# Patient Record
Sex: Female | Born: 1975 | Race: White | Hispanic: No | Marital: Single | Smoking: Current every day smoker
Health system: Southern US, Community
[De-identification: ages and names within clinical notes are randomized; demographics above are authoritative.]

## PROBLEM LIST (undated history)

## (undated) DIAGNOSIS — IMO0002 Reserved for concepts with insufficient information to code with codable children: Secondary | ICD-10-CM

## (undated) HISTORY — PX: TUBAL LIGATION: SHX77

---

## 1998-06-01 ENCOUNTER — Emergency Department (HOSPITAL_COMMUNITY): Admission: EM | Admit: 1998-06-01 | Discharge: 1998-06-01 | Payer: Self-pay | Admitting: Emergency Medicine

## 1998-07-05 ENCOUNTER — Inpatient Hospital Stay (HOSPITAL_COMMUNITY): Admission: AD | Admit: 1998-07-05 | Discharge: 1998-07-05 | Payer: Self-pay | Admitting: Family Medicine

## 1999-01-08 ENCOUNTER — Inpatient Hospital Stay (HOSPITAL_COMMUNITY): Admission: AD | Admit: 1999-01-08 | Discharge: 1999-01-13 | Payer: Self-pay | Admitting: *Deleted

## 1999-01-18 ENCOUNTER — Emergency Department (HOSPITAL_COMMUNITY): Admission: EM | Admit: 1999-01-18 | Discharge: 1999-01-18 | Payer: Self-pay | Admitting: Emergency Medicine

## 1999-01-18 ENCOUNTER — Encounter: Payer: Self-pay | Admitting: Emergency Medicine

## 1999-05-10 ENCOUNTER — Other Ambulatory Visit: Admission: RE | Admit: 1999-05-10 | Discharge: 1999-05-10 | Payer: Self-pay | Admitting: Obstetrics and Gynecology

## 1999-05-20 ENCOUNTER — Inpatient Hospital Stay (HOSPITAL_COMMUNITY): Admission: AD | Admit: 1999-05-20 | Discharge: 1999-05-20 | Payer: Self-pay | Admitting: Obstetrics & Gynecology

## 1999-05-26 ENCOUNTER — Inpatient Hospital Stay (HOSPITAL_COMMUNITY): Admission: AD | Admit: 1999-05-26 | Discharge: 1999-05-26 | Payer: Self-pay | Admitting: Obstetrics and Gynecology

## 1999-06-01 ENCOUNTER — Encounter: Payer: Self-pay | Admitting: Obstetrics and Gynecology

## 1999-06-01 ENCOUNTER — Inpatient Hospital Stay (HOSPITAL_COMMUNITY): Admission: AD | Admit: 1999-06-01 | Discharge: 1999-06-04 | Payer: Self-pay | Admitting: Obstetrics and Gynecology

## 1999-06-01 ENCOUNTER — Ambulatory Visit (HOSPITAL_COMMUNITY): Admission: RE | Admit: 1999-06-01 | Discharge: 1999-06-01 | Payer: Self-pay | Admitting: Obstetrics and Gynecology

## 1999-06-02 ENCOUNTER — Encounter (INDEPENDENT_AMBULATORY_CARE_PROVIDER_SITE_OTHER): Payer: Self-pay | Admitting: Specialist

## 1999-08-14 ENCOUNTER — Inpatient Hospital Stay (HOSPITAL_COMMUNITY): Admission: AD | Admit: 1999-08-14 | Discharge: 1999-08-14 | Payer: Self-pay | Admitting: *Deleted

## 1999-08-14 ENCOUNTER — Encounter (INDEPENDENT_AMBULATORY_CARE_PROVIDER_SITE_OTHER): Payer: Self-pay

## 1999-11-02 ENCOUNTER — Encounter: Payer: Self-pay | Admitting: Emergency Medicine

## 1999-11-02 ENCOUNTER — Emergency Department (HOSPITAL_COMMUNITY): Admission: EM | Admit: 1999-11-02 | Discharge: 1999-11-02 | Payer: Self-pay | Admitting: Emergency Medicine

## 1999-11-03 ENCOUNTER — Encounter: Payer: Self-pay | Admitting: Emergency Medicine

## 1999-12-27 ENCOUNTER — Inpatient Hospital Stay (HOSPITAL_COMMUNITY): Admission: AD | Admit: 1999-12-27 | Discharge: 1999-12-27 | Payer: Self-pay | Admitting: Obstetrics & Gynecology

## 2000-11-27 ENCOUNTER — Other Ambulatory Visit: Admission: RE | Admit: 2000-11-27 | Discharge: 2000-11-27 | Payer: Self-pay | Admitting: Obstetrics and Gynecology

## 2000-12-18 ENCOUNTER — Encounter: Payer: Self-pay | Admitting: Obstetrics and Gynecology

## 2000-12-18 ENCOUNTER — Ambulatory Visit (HOSPITAL_COMMUNITY): Admission: RE | Admit: 2000-12-18 | Discharge: 2000-12-18 | Payer: Self-pay | Admitting: Obstetrics and Gynecology

## 2000-12-30 ENCOUNTER — Inpatient Hospital Stay (HOSPITAL_COMMUNITY): Admission: RE | Admit: 2000-12-30 | Discharge: 2001-01-02 | Payer: Self-pay | Admitting: *Deleted

## 2000-12-30 ENCOUNTER — Encounter: Payer: Self-pay | Admitting: *Deleted

## 2000-12-30 ENCOUNTER — Encounter (HOSPITAL_COMMUNITY): Admission: RE | Admit: 2000-12-30 | Discharge: 2001-03-25 | Payer: Self-pay | Admitting: *Deleted

## 2001-01-15 ENCOUNTER — Encounter: Admission: RE | Admit: 2001-01-15 | Discharge: 2001-01-15 | Payer: Self-pay | Admitting: Obstetrics

## 2001-01-29 ENCOUNTER — Encounter: Admission: RE | Admit: 2001-01-29 | Discharge: 2001-01-29 | Payer: Self-pay | Admitting: Obstetrics

## 2001-02-04 ENCOUNTER — Inpatient Hospital Stay (HOSPITAL_COMMUNITY): Admission: AD | Admit: 2001-02-04 | Discharge: 2001-02-10 | Payer: Self-pay | Admitting: Obstetrics

## 2001-02-05 ENCOUNTER — Encounter: Payer: Self-pay | Admitting: Obstetrics

## 2001-02-18 ENCOUNTER — Encounter: Admission: RE | Admit: 2001-02-18 | Discharge: 2001-02-18 | Payer: Self-pay | Admitting: Obstetrics & Gynecology

## 2001-02-26 ENCOUNTER — Inpatient Hospital Stay (HOSPITAL_COMMUNITY): Admission: AD | Admit: 2001-02-26 | Discharge: 2001-03-04 | Payer: Self-pay | Admitting: Obstetrics & Gynecology

## 2001-02-28 ENCOUNTER — Encounter: Payer: Self-pay | Admitting: *Deleted

## 2001-03-14 ENCOUNTER — Inpatient Hospital Stay (HOSPITAL_COMMUNITY): Admission: AD | Admit: 2001-03-14 | Discharge: 2001-03-14 | Payer: Self-pay | Admitting: *Deleted

## 2001-03-15 ENCOUNTER — Encounter: Payer: Self-pay | Admitting: *Deleted

## 2001-03-15 ENCOUNTER — Inpatient Hospital Stay (HOSPITAL_COMMUNITY): Admission: AD | Admit: 2001-03-15 | Discharge: 2001-03-15 | Payer: Self-pay | Admitting: *Deleted

## 2001-03-18 ENCOUNTER — Encounter: Admission: RE | Admit: 2001-03-18 | Discharge: 2001-03-18 | Payer: Self-pay | Admitting: Obstetrics & Gynecology

## 2001-03-20 ENCOUNTER — Inpatient Hospital Stay (HOSPITAL_COMMUNITY): Admission: AD | Admit: 2001-03-20 | Discharge: 2001-03-27 | Payer: Self-pay | Admitting: *Deleted

## 2001-03-20 ENCOUNTER — Encounter: Payer: Self-pay | Admitting: *Deleted

## 2001-03-20 ENCOUNTER — Encounter (INDEPENDENT_AMBULATORY_CARE_PROVIDER_SITE_OTHER): Payer: Self-pay

## 2001-03-22 ENCOUNTER — Encounter: Payer: Self-pay | Admitting: Obstetrics & Gynecology

## 2001-03-29 ENCOUNTER — Inpatient Hospital Stay (HOSPITAL_COMMUNITY): Admission: AD | Admit: 2001-03-29 | Discharge: 2001-03-29 | Payer: Self-pay | Admitting: Obstetrics

## 2001-04-04 ENCOUNTER — Emergency Department (HOSPITAL_COMMUNITY): Admission: EM | Admit: 2001-04-04 | Discharge: 2001-04-04 | Payer: Self-pay | Admitting: Emergency Medicine

## 2001-04-04 ENCOUNTER — Encounter: Payer: Self-pay | Admitting: Emergency Medicine

## 2002-06-10 ENCOUNTER — Encounter: Payer: Self-pay | Admitting: Emergency Medicine

## 2002-06-10 ENCOUNTER — Emergency Department (HOSPITAL_COMMUNITY): Admission: EM | Admit: 2002-06-10 | Discharge: 2002-06-10 | Payer: Self-pay | Admitting: Emergency Medicine

## 2002-06-12 ENCOUNTER — Inpatient Hospital Stay (HOSPITAL_COMMUNITY): Admission: AD | Admit: 2002-06-12 | Discharge: 2002-06-20 | Payer: Self-pay | Admitting: Psychiatry

## 2002-06-21 ENCOUNTER — Other Ambulatory Visit (HOSPITAL_COMMUNITY): Admission: RE | Admit: 2002-06-21 | Discharge: 2002-06-30 | Payer: Self-pay | Admitting: Psychiatry

## 2002-08-02 ENCOUNTER — Inpatient Hospital Stay (HOSPITAL_COMMUNITY): Admission: EM | Admit: 2002-08-02 | Discharge: 2002-08-11 | Payer: Self-pay | Admitting: Psychiatry

## 2002-08-11 ENCOUNTER — Encounter: Payer: Self-pay | Admitting: Emergency Medicine

## 2002-08-11 ENCOUNTER — Emergency Department (HOSPITAL_COMMUNITY): Admission: EM | Admit: 2002-08-11 | Discharge: 2002-08-11 | Payer: Self-pay | Admitting: Emergency Medicine

## 2002-09-05 ENCOUNTER — Encounter: Admission: RE | Admit: 2002-09-05 | Discharge: 2002-09-05 | Payer: Self-pay | Admitting: Orthopedic Surgery

## 2002-09-05 ENCOUNTER — Encounter: Payer: Self-pay | Admitting: Orthopedic Surgery

## 2003-01-20 ENCOUNTER — Encounter: Admission: RE | Admit: 2003-01-20 | Discharge: 2003-02-15 | Payer: Self-pay | Admitting: Anesthesiology

## 2003-04-12 ENCOUNTER — Inpatient Hospital Stay (HOSPITAL_COMMUNITY): Admission: EM | Admit: 2003-04-12 | Discharge: 2003-04-18 | Payer: Self-pay | Admitting: Psychiatry

## 2003-06-04 ENCOUNTER — Inpatient Hospital Stay (HOSPITAL_COMMUNITY): Admission: EM | Admit: 2003-06-04 | Discharge: 2003-06-09 | Payer: Self-pay | Admitting: Psychiatry

## 2003-11-02 ENCOUNTER — Inpatient Hospital Stay (HOSPITAL_COMMUNITY): Admission: EM | Admit: 2003-11-02 | Discharge: 2003-11-07 | Payer: Self-pay | Admitting: Psychiatry

## 2004-03-10 ENCOUNTER — Inpatient Hospital Stay (HOSPITAL_COMMUNITY): Admission: EM | Admit: 2004-03-10 | Discharge: 2004-03-17 | Payer: Self-pay | Admitting: *Deleted

## 2004-06-15 ENCOUNTER — Inpatient Hospital Stay (HOSPITAL_COMMUNITY): Admission: EM | Admit: 2004-06-15 | Discharge: 2004-06-20 | Payer: Self-pay | Admitting: Psychiatry

## 2004-07-02 ENCOUNTER — Other Ambulatory Visit: Admission: RE | Admit: 2004-07-02 | Discharge: 2004-07-02 | Payer: Self-pay | Admitting: Family Medicine

## 2005-03-31 ENCOUNTER — Inpatient Hospital Stay (HOSPITAL_COMMUNITY): Admission: EM | Admit: 2005-03-31 | Discharge: 2005-04-05 | Payer: Self-pay | Admitting: Psychiatry

## 2005-03-31 ENCOUNTER — Ambulatory Visit: Payer: Self-pay | Admitting: Psychiatry

## 2005-04-20 ENCOUNTER — Inpatient Hospital Stay (HOSPITAL_COMMUNITY): Admission: RE | Admit: 2005-04-20 | Discharge: 2005-04-23 | Payer: Self-pay | Admitting: Psychiatry

## 2005-05-15 ENCOUNTER — Emergency Department (HOSPITAL_COMMUNITY): Admission: EM | Admit: 2005-05-15 | Discharge: 2005-05-15 | Payer: Self-pay | Admitting: Emergency Medicine

## 2005-06-06 ENCOUNTER — Other Ambulatory Visit: Admission: RE | Admit: 2005-06-06 | Discharge: 2005-06-06 | Payer: Self-pay | Admitting: Family Medicine

## 2005-07-04 ENCOUNTER — Emergency Department (HOSPITAL_COMMUNITY): Admission: EM | Admit: 2005-07-04 | Discharge: 2005-07-05 | Payer: Self-pay | Admitting: Emergency Medicine

## 2005-08-18 ENCOUNTER — Emergency Department (HOSPITAL_COMMUNITY): Admission: EM | Admit: 2005-08-18 | Discharge: 2005-08-18 | Payer: Self-pay

## 2005-08-19 ENCOUNTER — Emergency Department (HOSPITAL_COMMUNITY): Admission: EM | Admit: 2005-08-19 | Discharge: 2005-08-19 | Payer: Self-pay | Admitting: Family Medicine

## 2005-11-25 ENCOUNTER — Inpatient Hospital Stay (HOSPITAL_COMMUNITY): Admission: RE | Admit: 2005-11-25 | Discharge: 2005-11-28 | Payer: Self-pay | Admitting: Psychiatry

## 2005-11-26 ENCOUNTER — Ambulatory Visit: Payer: Self-pay | Admitting: Psychiatry

## 2006-04-21 ENCOUNTER — Emergency Department (HOSPITAL_COMMUNITY): Admission: EM | Admit: 2006-04-21 | Discharge: 2006-04-22 | Payer: Self-pay | Admitting: Emergency Medicine

## 2008-10-31 ENCOUNTER — Emergency Department (HOSPITAL_COMMUNITY): Admission: EM | Admit: 2008-10-31 | Discharge: 2008-10-31 | Payer: Self-pay | Admitting: Emergency Medicine

## 2009-05-25 ENCOUNTER — Inpatient Hospital Stay (HOSPITAL_COMMUNITY): Admission: AD | Admit: 2009-05-25 | Discharge: 2009-05-25 | Payer: Self-pay | Admitting: Obstetrics & Gynecology

## 2009-10-16 ENCOUNTER — Emergency Department (HOSPITAL_COMMUNITY): Admission: EM | Admit: 2009-10-16 | Discharge: 2009-10-16 | Payer: Self-pay | Admitting: Emergency Medicine

## 2010-02-26 ENCOUNTER — Emergency Department (HOSPITAL_COMMUNITY): Admission: EM | Admit: 2010-02-26 | Discharge: 2010-02-26 | Payer: Self-pay | Admitting: Emergency Medicine

## 2010-10-09 ENCOUNTER — Emergency Department (HOSPITAL_COMMUNITY)
Admission: EM | Admit: 2010-10-09 | Discharge: 2010-10-09 | Payer: Self-pay | Source: Home / Self Care | Admitting: Emergency Medicine

## 2011-01-13 ENCOUNTER — Emergency Department (HOSPITAL_COMMUNITY)
Admission: EM | Admit: 2011-01-13 | Discharge: 2011-01-13 | Discharge status: Against Medical Advice | Payer: Medicare Other | Attending: Emergency Medicine | Admitting: Emergency Medicine

## 2011-01-13 DIAGNOSIS — R51 Headache: Secondary | ICD-10-CM | POA: Insufficient documentation

## 2011-01-13 DIAGNOSIS — R05 Cough: Secondary | ICD-10-CM | POA: Insufficient documentation

## 2011-01-13 DIAGNOSIS — R059 Cough, unspecified: Secondary | ICD-10-CM | POA: Insufficient documentation

## 2011-01-13 DIAGNOSIS — R197 Diarrhea, unspecified: Secondary | ICD-10-CM | POA: Insufficient documentation

## 2011-01-13 DIAGNOSIS — R112 Nausea with vomiting, unspecified: Secondary | ICD-10-CM | POA: Insufficient documentation

## 2011-03-08 ENCOUNTER — Emergency Department (HOSPITAL_COMMUNITY)
Admission: EM | Admit: 2011-03-08 | Discharge: 2011-03-08 | Disposition: A | Discharge status: Home / Self Care | Payer: Medicare Other | Attending: Emergency Medicine | Admitting: Emergency Medicine

## 2011-03-08 ENCOUNTER — Emergency Department (HOSPITAL_COMMUNITY): Payer: Medicare Other

## 2011-03-08 DIAGNOSIS — S99919A Unspecified injury of unspecified ankle, initial encounter: Secondary | ICD-10-CM | POA: Insufficient documentation

## 2011-03-08 DIAGNOSIS — Z79899 Other long term (current) drug therapy: Secondary | ICD-10-CM | POA: Insufficient documentation

## 2011-03-08 DIAGNOSIS — M25476 Effusion, unspecified foot: Secondary | ICD-10-CM | POA: Insufficient documentation

## 2011-03-08 DIAGNOSIS — F411 Generalized anxiety disorder: Secondary | ICD-10-CM | POA: Insufficient documentation

## 2011-03-08 DIAGNOSIS — M25519 Pain in unspecified shoulder: Secondary | ICD-10-CM | POA: Insufficient documentation

## 2011-03-08 DIAGNOSIS — S8990XA Unspecified injury of unspecified lower leg, initial encounter: Secondary | ICD-10-CM | POA: Insufficient documentation

## 2011-03-08 DIAGNOSIS — M79609 Pain in unspecified limb: Secondary | ICD-10-CM | POA: Insufficient documentation

## 2011-03-08 DIAGNOSIS — F319 Bipolar disorder, unspecified: Secondary | ICD-10-CM | POA: Insufficient documentation

## 2011-03-08 DIAGNOSIS — M25579 Pain in unspecified ankle and joints of unspecified foot: Secondary | ICD-10-CM | POA: Insufficient documentation

## 2011-03-08 DIAGNOSIS — M546 Pain in thoracic spine: Secondary | ICD-10-CM | POA: Insufficient documentation

## 2011-03-08 DIAGNOSIS — IMO0002 Reserved for concepts with insufficient information to code with codable children: Secondary | ICD-10-CM | POA: Insufficient documentation

## 2011-03-08 DIAGNOSIS — M25473 Effusion, unspecified ankle: Secondary | ICD-10-CM | POA: Insufficient documentation

## 2011-03-08 DIAGNOSIS — R209 Unspecified disturbances of skin sensation: Secondary | ICD-10-CM | POA: Insufficient documentation

## 2011-03-08 DIAGNOSIS — S93409A Sprain of unspecified ligament of unspecified ankle, initial encounter: Secondary | ICD-10-CM | POA: Insufficient documentation

## 2011-03-09 ENCOUNTER — Emergency Department (HOSPITAL_COMMUNITY)
Admission: EM | Admit: 2011-03-09 | Discharge: 2011-03-09 | Disposition: A | Discharge status: Home / Self Care | Payer: No Typology Code available for payment source | Attending: Emergency Medicine | Admitting: Emergency Medicine

## 2011-03-09 ENCOUNTER — Emergency Department (HOSPITAL_COMMUNITY): Payer: No Typology Code available for payment source

## 2011-03-09 DIAGNOSIS — M542 Cervicalgia: Secondary | ICD-10-CM | POA: Insufficient documentation

## 2011-03-09 DIAGNOSIS — Y92410 Unspecified street and highway as the place of occurrence of the external cause: Secondary | ICD-10-CM | POA: Insufficient documentation

## 2011-03-09 DIAGNOSIS — T1490XA Injury, unspecified, initial encounter: Secondary | ICD-10-CM | POA: Insufficient documentation

## 2011-03-18 LAB — GC/CHLAMYDIA PROBE AMP, GENITAL
Chlamydia, DNA Probe: NEGATIVE
GC Probe Amp, Genital: NEGATIVE

## 2011-03-18 LAB — URINALYSIS, ROUTINE W REFLEX MICROSCOPIC
Bilirubin Urine: NEGATIVE
Leukocytes, UA: NEGATIVE
Urobilinogen, UA: 0.2 mg/dL (ref 0.0–1.0)
pH: 6 (ref 5.0–8.0)

## 2011-03-18 LAB — POCT PREGNANCY, URINE: Preg Test, Ur: NEGATIVE

## 2011-03-18 LAB — WET PREP, GENITAL: Trich, Wet Prep: NONE SEEN

## 2011-03-18 LAB — URINE MICROSCOPIC-ADD ON
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
## 2011-04-26 NOTE — Discharge Summary (Signed)
[name], [name] NO.:  [id number]   MEDICAL RECORD NO.:  [id number]                    PATIENT TYPE:  IPS   LOCATION:                                       FACILITY:  [facility]   PHYSICIAN:  Jeanice Lim, MD                DATE OF BIRTH:  12/23/1975   DATE OF ADMISSION:  06/12/2002  DATE OF DISCHARGE:  06/20/2002                                 DISCHARGE SUMMARY   IDENTIFYING DATA:  This is a 35 year old single Caucasian female with  history of bipolar disorder, with a two-week history of increased  irritability, poor sleeping, ignoring household chores and having difficulty  attending her child.   PAST PSYCHIATRIC HISTORY:  The patient is followed by Dr. Madilyn Fireman at [facility].   MEDICATIONS:  Adderall for ADHD symptoms.   ALLERGIES:  None.   PHYSICAL EXAMINATION:  Essentially within normal limits.  Neurologically  nonfocal.   LABORATORY DATA:  Urine drug screen positive for cocaine.  Otherwise  essentially within normal limits.   MENTAL STATUS EXAM:  Overweight, tall, healthy-appearing female in no acute  distress.  Blunted and irritable affect.  Cooperative.  Speech normal.  Mood  somewhat irritable, mildly depressed.  Thought process goal directed.  Thought content negative for psychotic symptoms or dangerous ideation.  Cognitively intact.  Judgment and insight fair.   ADMISSION DIAGNOSES:   AXIS I:  1. Mood disorder not otherwise specified.  2. Marijuana abuse.  3. Cocaine abuse.  4. Attention-deficit disorder by history.   AXIS II:  Rule out borderline personality disorder.   AXIS III:  Chronic lower back pain.   AXIS IV:  Moderate (primary support system limitations).   AXIS V:  35/65.   HOSPITAL COURSE:  The patient was admitted and ordered routine p.r.n.  medications and underwent further monitoring.  Trileptal was started and  optimized for mood stabilization.  The patient reported a positive response  and mother  was contact.  Felt patient had improved and was safe for  outpatient treatment.   CONDITION ON DISCHARGE:  Markedly improved.  Mood was more stable and  euthymic.  Affect brighter.  Thought processes goal directed.  Thought  content negative for dangerous ideation or psychotic symptoms.  The patient  reported motivation to be compliant with follow-up plan and remain sober.   DISCHARGE MEDICATIONS:  1. Celexa 20 mg q.h.s.  2. Abilify 10 mg q.h.s.  3. Trileptal 150 mg 3 p.m. and 3 q.h.s.  4. Vistaril 50 mg q.8h. p.r.n.   FOLLOW UP:  The patient was to follow up with NA and AA and followup with  [facility] on Monday, June 21, 2002.   DISCHARGE DIAGNOSES:   AXIS I:  1. Mood disorder not otherwise specified.  2. Marijuana abuse.  3. Cocaine abuse.  4. Attention-deficit disorder by history.  AXIS II:  Rule out borderline personality disorder.   AXIS III:  Chronic lower back pain.   AXIS IV:  Moderate (primary support system limitations).   AXIS V:  Global Assessment of Functioning on discharge 55.                                                 Jeanice Lim, MD    JEM/MEDQ  D:  08/19/2002  T:  08/20/2002  Job:  [id number]

## 2011-04-26 NOTE — Discharge Summary (Signed)
NAME:  [name], [name]                        ACCOUNT NO.:  [id number]   MEDICAL RECORD NO.:  [id number]                   PATIENT TYPE:  IPS   LOCATION:  0503                                 FACILITY:  [facility]   PHYSICIAN:  Jeanice Lim, M.D.              DATE OF BIRTH:  06/01/1976   DATE OF ADMISSION:  06/04/2003  DATE OF DISCHARGE:  06/09/2003                                 DISCHARGE SUMMARY   IDENTIFYING DATA:  This is a 35 year old Caucasian, single female.  She  presented to [facility] requesting long-term placement for  substance abuse.  She reported suicidal and homicidal ideation.  She  reported depressive symptoms and wanted to stop abusing drugs.   MEDICATIONS:  The patient had been noncompliant with Wellbutrin and  Risperdal.   DRUG ALLERGIES:  No known drug allergies.   PHYSICAL EXAMINATION:  GENERAL:  Essentially within normal limits.  NEUROLOGIC:  Nonfocal.   LABORATORY DATA:  Routine admission labs: Within normal limits.   MENTAL STATUS EXAM:  Alert and oriented, fair hygiene, cooperative.  Speech:  Not pressured.  Mood: Mildly depressed.  Affect: Restricted.  Thought  process: Goal directed.  Thought content: Negative for dangerous ideation or  psychotic symptoms.  At the time of evaluation, cognition was intact.  Judgment and insight: Fair.   ADMISSION DIAGNOSES:   AXIS I:  1. Substance-induced mood disorder.  2. Cocaine dependence.  3. Marijuana abuse.  4. Possible depression, not otherwise specified.   AXIS II:  Deferred.   AXIS III:  1. Degenerative joint disease.  2. Chronic pain.  3. Obesity.  4. Status post Cesarean section.   AXIS IV:  Moderate; limited support system, economic problems, problems  related to legal system, and medical problems.   AXIS V:  [id number]   HOSPITAL COURSE:  The patient was admitted, ordered routine p.r.n.  medications, underwent further monitoring, and was encouraged to participate  in individual,  group, and milieu therapy.  The patient was treated for  withdrawal symptoms, continued on detoxification monitoring for safety and  stabilized on Trileptal and Risperdal.  The patient complained of upper  respiratory infection symptoms and these were evaluated and treated and she  was stabilized on medications, reporting motivation to follow up with [facility].  The patient's mother was concerned that the patient was not ready to  be discharged, wanting her to go directly to residential treatment.  However, the patient was comfortable with this plan, which had been agreed  upon with aftercare planning, the case manger, and the patient.   CONDITION ON DISCHARGE:  The patient's condition on discharge was improved.  Mood was more euthymic.  Affect: Brighter.  Thought processes: Goal  directed.  Thought content: Negative for dangerous ideation or psychotic  symptoms.   DISCHARGE MEDICATIONS:  1. Risperdal 0.25 mg q.h.s.  2. Wellbutrin XL 300 mg q.a.m.  3. Trileptal 300 mg one  half q.a.m., one half at 3 p.m., and one q.h.s.  4. Ventolin 90 mcg inhaler two puffs q.6.h. p.r.n.   FOLLOW UP:  The patient was discharged to follow up at [facility] on Wednesday, July 7 at 9 a.m.   DISCHARGE DIAGNOSES:   AXIS I:  1. Substance-induced mood disorder.  2. Cocaine dependence.  3. Marijuana abuse.  4. Possible depression, not otherwise specified.   AXIS II:  Deferred.   AXIS III:  1. Degenerative joint disease.  2. Chronic pain.  3. Obesity.  4. Status post Cesarean section.   AXIS IV:  Moderate; limited support system, economic problems, problems  related to legal system, and medical problems.   AXIS V:  Global assessment of functioning on discharge was 50-55.                                               Jeanice Lim, M.D.    JEM/MEDQ  D:  06/30/2003  T:  07/01/2003  Job:  [id number]
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
## 2011-04-26 NOTE — H&P (Signed)
NAME:  [name], [name]                        ACCOUNT NO.:  [id number]   MEDICAL RECORD NO.:  [id number]                   PATIENT TYPE:  IPS   LOCATION:  0503                                 FACILITY:  [facility]   PHYSICIAN:  Geoffery Lyons, M.D.                   DATE OF BIRTH:  01-Oct-1976   DATE OF ADMISSION:  06/04/2003  DATE OF DISCHARGE:                         PSYCHIATRIC ADMISSION ASSESSMENT   IDENTIFYING INFORMATION:  This is a voluntary admission.  This 35 year old  white single female presented to our intake office here at [facility] last evening requesting long-term placement for her substance abuse.  She stated that she had presented earlier in the day over at [facility] but  was kept waiting in the emergency room for 4 hours and left AMA.  She  presented here endorsing suicidal and homicidal ideation.  She states that  she knows she needs to beat her depression and stop abusing drugs.  She  states that she is crying for no reason, sleeping all the time, and not  wanting to be alive.  She uses powder and rock cocaine, marijuana.  She has  used methamphetamines, ketamine, lots of ecstasy, acid, etc. in the past.  She says about the only substance she does not use if heroin.  She said it  worries her because she does not even enjoy using anymore.  It is like  having a job.   PAST PSYCHIATRIC HISTORY:  The patient acknowledges 8 prior admissions over  the past 5 years.  She is supposed to be followed down at [facility] by Dr. Hortencia Pilar, however she is noncompliant with her  appointments.   SOCIAL HISTORY:  She states that she takes college courses.  She lives in a  house with her 3 children, her son age 53, her boy-girl twins who are 2 years  old.  She gets SSI.  She has Section 8 housing.  She has rendered disabled  due to her degenerative disk disease and her mental illness.  Her children  have different fathers.  The 50 year old  unfortunately is not getting  financial support from his father.  The twins are getting WIC and food  stamps.   FAMILY HISTORY:  The patient says she has an aunt who is well known to this  facility who also has bipolar disorder.   ALCOHOL AND DRUG ABUSE:  The patient acknowledges smoking 1-1/2 packs of  cigarettes per day for the past 12 years, extensive substance abuse as  already indicated, and does not usually abuse alcohol.   PAST MEDICAL HISTORY:  Primary care Arnetra Terris is Dr. Adonis Housekeeper at [facility].  Apparently he prescribes Adderall for her when she needs  to focus and concentrate and go to class.  She is followed by Dr. Charlett Blake for  degenerative disk disease and the pain clinic prescribes Percocet when  needed for her.  She claims that she is currently prescribed Wellbutrin XL  150 mg a day, Trileptal 150 mg q.i.d., Risperdal 0.25 mg q.i.d.  Needless to  say she has been noncompliant with all of these medications.   ALLERGIES:  No known drug allergies.   POSITIVE PHYSICAL FINDINGS:  PHYSICAL EXAMINATION:  This is an obese white  female in no acute distress.  Physically she is not giving any indication  for withdrawal from her substances.  HEENT:  Within normal limits.  CHEST:  Changes consistent with her smoking history.  She has some rough  inhalation sounds, no true wheezes or rhonchi.  HEART:  Regular rate and rhythm without murmurs, rubs or gallops.  ABDOMEN:  Soft.  It is obese.  She has a well-healed C-section scar and  stretch marks.  MUSCULOSKELETAL SYSTEM:  Reveals no clubbing, cyanosis, edema, no apparent  limitation to range of motion.  NEUROLOGICALLY:  Cranial nerves II-XII are grossly intact and specifically  she has no tremor.   MENTAL STATUS EXAM:  She is alert and oriented x3.  She has normal hygiene.  She is cooperative.  Her speech was not pressured, it is normal rate, rhythm  and tone.  Her mood was normal.  She is goal directed.  She  specifically  requests a long-term treatment program.  Her thought processes were intact.  Her concentration and memory are present.  Judgment and insight are fair.  Her intelligence level is at least average.  Specifically, she had no  reports for auditory or visual hallucinations and her biggest motivation is  that the drugs are not pleasurable anymore.   ADMISSION DIAGNOSES:   AXIS I:  1. Substance-induced mood disorder.  2. Cocaine dependence.  3. Marijuana abuse.  4. Other substances abuse.   AXIS II:  No diagnosis.   AXIS III:  Degenerative disk disease with chronic pain, obesity, status post  C-section.   AXIS IV:  Moderate.  She has problems with her primary support group,  economic problems, problems related to the legal system and crime, and other  psychosocial problems and medical problems.   AXIS V:  Global assessment of function is 40, in the past year probably 80.   PLAN:  Admit to stabilize.  To try to place in long-term drug rehab, and to  restart psychotropics as indicated.   ESTIMATED LENGTH OF STAY:  3-4 days.        Mickie Leonarda Salon, P.A.-C.               Geoffery Lyons, M.D.    MD/MEDQ  D:  06/05/2003  T:  06/05/2003  Job:  [id number]

## 2011-04-26 NOTE — H&P (Signed)
NAME:  [name], [name]                        ACCOUNT NO.:  [id number]   MEDICAL RECORD NO.:  [id number]                   PATIENT TYPE:  IPS   LOCATION:  0508                                 [facility]ACILITY:  [facility]   PHYSICIAN:  Geoffery Lyons, M.D.                   DATE OF BIRTH:  1976-05-16   DATE OF ADMISSION:  04/12/2003  DATE OF DISCHARGE:                         PSYCHIATRIC ADMISSION ASSESSMENT   DATE OF ASSESSMENT:  Apr 13, 2003   PATIENT IDENTIFICATION:  This is a 35 year old single white female who is a  voluntary admission.   HISTORY OF PRESENT ILLNESS:  This patient presented herself to the hospital  with mood swings over the past week.  She has not had any medications for  the past two months.  She has been using cocaine and marijuana with the last  use of cocaine one day prior to admission.  She endorses rapid thoughts,  poor sleep, and intermittent suicidal ideation without any clear plan.  She  denies any homicidal ideation or auditory and visual hallucinations.  She  has been living at home with her children and her boyfriend, whom she says  is supportive.   PAST PSYCHIATRIC HISTORY:  The patient has been previously seen at [facility], has not been there in quite some time.  This is  her third admission to [facility].  She has a  history of polysubstance abuse and depressed mood along with some periodic  chronic back pain secondary to degenerative joint disease.  She also has a  history of being diagnosed with attention-deficit disorder for which she  takes Adderall.   SUBSTANCE ABUSE HISTORY:  The patient has a history of abusing marijuana  approximately one to two times a week and cocaine on a fairly regular basis,  most recently the day prior to admission.  She states that her cocaine abuse  has increased somewhat recently.   PAST MEDICAL HISTORY:  The patient is followed by [facility] And[facility]  for her degenerative joint disease.  She has not been there  for several months and is also followed by Danielle L. Mahaffey, M.D., at  [facility], who is her primary care physician.  Current medical  problems include chronic back pain, degenerative disk disease, and some  chronic cystic acne that occurs over her abdomen, shoulders, neck, and face.  Past medical history is remarkable for no history of sexually transmitted  disease and she has had a bilateral tubal ligation.  No history of seizures,  blackouts, or memory loss.   MEDICATIONS:  1. Adderall 20 mg daily when she is in school and as needed for her ADHD     symptoms.  2. Trileptal 150 mg q.a.m. and 300 mg at h.s., which was prescribed for her     previously, but she has not taken in more than  a couple of months.  3. Percocet that she uses for pain.  4. Wellbutrin, dose unclear, which she also has not taken for quite some     time.  5. She previously also was on minocycline for her acne.   DRUG ALLERGIES:  None.   REVIEW OF SYSTEMS:  The patient reports no back pain at the time of  assessment.   PHYSICAL EXAMINATION:  GENERAL:  This is a well nourished, well developed,  tall and large built female who is fully alert in no acute distress,  although she seems a little bit fidgety.  Her grooming is satisfactory.  VITAL SIGNS:  Vital signs on admission to the unit were normal: Temperature  97.9, pulse 76, respirations 20, blood pressure 128/81.  She is  approximately 5 feet 11 inches tall; this is an estimate, possibly taller,  maybe 6 feet tall.  Weight: Approximately 245 pounds.  SKIN:  Skin is medium in tone and she does have some scarring from cystic  acne around her abdomen and groin area, also on her back but it is very mild  and rare comedones seen, mostly around her navel and midline of her abdomen.  HEENT:  She has dark brown hair worn to the middle of her back.  Head is  normocephalic, atraumatic.  EENT:  PERRLA.  Sclerae are nonicteric.  Hearing  intact to normal voice.  No rhinorrhea.  Oropharynx is satisfactory.  Tongue: Midline.  NECK:  Supple, no lymphadenopathy or thyromegaly.  CARDIOVASCULAR:  S1 and S2 are heard; no clicks, murmurs, or gallops.  LUNGS:  Clear to auscultation.  ABDOMEN:  Rounded, soft, nontender, no masses appreciated.  GENITALIA:  Deferred.  MUSCULOSKELETAL:  No erythema or swelling of any joints.  Gait is grossly  normal.  Posture: Upright.  Spine: Grossly straight.  Balance: Satisfactory.  NEUROLOGIC:  Cranial nerves II-XII are intact.  EOM are intact, no  nystagmus.  Facial symmetry is present.  Grip strength is equal bilaterally.  Deep tendon reflexes are 2+/5.  Cerebellar function is intact.  No findings  on Romberg.   SOCIAL HISTORY:  The patient is a single mom with three small children and  no legal charges at this time.  She lives in Section 8 housing here in  [place], currently is living with her children's father.  She is  originally from [place].  She has 38-year-old twins named Henrene Dodge and Gerilyn Pilgrim  and a 87-year-old son named Durene Cal.  She has been previously going to school  at [employer] and is in and out of school periodically.   FAMILY HISTORY:  Family history is remarkable for multiple family members  with histories of substance abuse.   MENTAL STATUS EXAM:  This is a large built female who is fully alert, well  groomed, cooperative and pleasant.  Her affect is bright.  She is somewhat  distracted and fidgety, twirls her hair and fiddles with it constantly  during the interview but otherwise is directable, generally cooperative,  pleasant.  Speech is within normal limits.  Mood is mildly irritable at  times.  Thought process: Distracted, somewhat rapid.  She is disjointed but  generally able to focus satisfactorily for the interview.  She has to use  some effort to concentrate.  She has vague suicidal ideation, no auditory and visual hallucinations,  no paranoia, no evidence of psychosis at this  time.  No clear plan for suicide, just feels hopeless and distracted; also  feels somewhat guilty about the extent  of her substance abuse.  Cognitive:  Intact and oriented x 3.   ADMISSION DIAGNOSES:   AXIS I:  1. Substance-induced mood disorder versus bipolar I disorder, depressed.  2. Cocaine abuse, rule out dependence.  3. Tetrahydrocannibinol abuse.   AXIS II:  Deferred.   AXIS III:  1. Cystic acne.  2. Chronic back pain.   AXIS IV:  Moderate relationship issues with her boyfriend and chronic  substance abuse.   AXIS V:  Current 29, past year 70.   INITIAL PLAN OF CARE:  Plan is to voluntarily admit the patient to alleviate  her suicidal ideation and to stabilize her mood.  We are going to ask the  case manager to please evaluate the home situation in light of her substance  abuse and the presence of three small children in the home.  We are planning  on restarting her Trileptal 150 mg p.o. q.a.m. and 300 mg p.o. q.h.s. with  the patient's input and she is agreeable with this plan.  We are also going  to restart her on Wellbutrin XL 150 mg p.o. q.a.m.  We have considered  starting her back on pain medications but she has been without this for  several weeks and remarks at this time that she really has no pain so we  will go ahead and wait on this and see how she responds.  We have discussed  the plans with her.   ESTIMATED LENGTH OF STAY:  Five days.     Margaret A. Scott, N.P.                   Geoffery Lyons, M.D.    MAS/MEDQ  D:  04/18/2003  T:  04/18/2003  Job:  [id number]

## 2011-04-26 NOTE — Op Note (Signed)
[facility]  Patient:    [name], [name]                     MRN: [id number] Proc. Date: 03/24/01 Adm. Date:  [id number] Attending:  Michaelle Copas Dictator:   Jamey Reas, M.D.                           Operative Report  DATE OF BIRTH:                05/14/1976  PREOPERATIVE DIAGNOSES:       A 32 week intrauterine pregnancy, twins, status post vertex delivery of baby A, baby B with persistent transverse lie with back up, desire for permanent sterilization.  POSTOPERATIVE DIAGNOSES:      A 32 week intrauterine pregnancy, twins, status post vertex delivery of baby A, baby B with persistent transverse lie with back up, desire for permanent sterilization.  PROCEDURE:                    Primary low transverse cesarean section via Pfannenstiel and bilateral tubal ligation.  SURGEON:                      Conni Elliot, M.D.  ASSISTANT:                    Jamey Reas, M.D.  ANESTHESIA:                   Epidural.  COMPLICATIONS:                None.  ESTIMATED BLOOD LOSS:         800 cc.  IV FLUIDS:                    Lactated Ringers 1800 cc.  SPECIMENS:                    Bilateral portion of tubes.  URINE OUTPUT:                 Adequate.  INDICATIONS:                  A 35 year old G7, P1-1-5-3 status post vaginal delivery of baby A who had persistent transverse lie of baby B required emergency cesarean section.  FINDINGS:                     Female infant in transverse presentation delivered breech, clear fluid, NICU team present at delivery, Apgars 8 at one minute and 8 at five minutes.  Otherwise, normal uterus, tubes, and ovaries.  PROCEDURE:                    Patient was brought to the operating room for a double setup and vaginal delivery.  Twin A was delivered vertex without complications, cephalic presentation.  This was handed off to NICU team.  Dr. Gavin Potters then intervened and checked lie of  baby B.  Found to be in transverse lie which was persistent, back up, and inability to turn fetus to cephalic or breech position.  Emergency setup was used.  The patient was found to have epidural anesthesia that was adequate.  She was then prepped and draped in a normal sterile fashion in the dorsal supine position with a leftward tilt.  Pfannenstiel skin incision was then made with the scalpel and carried through to the underlying layer of fascia with the knife.  The fascia was incised in the midline, incision extended laterally with blunt dissection.  The superior aspect of the fascial incision was then also dissected bluntly.  Similar attention was used to the posterior aspect of the fascia.  The rectus muscles were then separated in the midline, the peritoneum identified, tented up, and entered without blunt dissection.  Peritoneal incision was then extended superiorly and inferiorly.  Good visualization of the bladder.  The bladder blade was then inserted and the vesicouterine peritoneum identified, grasped with pickups, and entered sharply with Metzenbaum scissors.  This incision was then extended laterally and the bladder flap created digitally.  The bladder blade was then reinserted and the lower uterine segment incised in a transverse fashion with the scalpel.  Uterine incision was then extended laterally with blunt dissection.  The bladder blade was removed and the infant was delivered atraumatically in the breech position.  The nose and mouth were suctioned with bulb suction, the cord clamped and cut.  The infant was handed off to waiting NICU team.  Cord gases were sent.  The placenta was then removed manually, the uterus exteriorized and cleared of all clots and debris. The uterine incision was repaired with 1-0 Chromic in a running locked fashion.  The second layer of the same suture was used to obtain excellent hemostasis.  The bladder flap was not repaired.  The uterus was  returned to the abdomen.  The gutters were cleared of all clots and debris.  The uterine incision was found to be hemostatic.  The peritoneum was closed with 3-0 Vicryl.  Fascia was reapproximated with 0 Vicryl in a running fashion.  The subcutaneous tissue was closed with 0 Monocryl and a large needle.  The skin was closed with staples.  The patient tolerated procedure well.  Sponge, lap, and needle counts were correct x 2.  Unasyn 3 g was given at cord clamp.  The patient was taken to the recovery room in stable condition.  Cord gases were found to be cord pH of 7.28.  Patient tolerated procedure well. DD:  03/24/01 TD:  03/24/01 Job: [id number]

## 2011-04-26 NOTE — Discharge Summary (Signed)
NAME:  [name], [name]                        ACCOUNT NO.:  [id number]   MEDICAL RECORD NO.:  [id number]                   PATIENT TYPE:  IPS   LOCATION:  0508                                 [facility]ACILITY:  [facility]   PHYSICIAN:  Jeanice Lim, M.D.              DATE OF BIRTH:  Oct 18, 1976   DATE OF ADMISSION:  08/02/2002  DATE OF DISCHARGE:  08/11/2002                                 DISCHARGE SUMMARY   IDENTIFYING DATA:  This is a 35 year old single Caucasian female voluntarily  admitted regretting having stopped her medications.  Relapsed on cocaine and  marijuana.  Feeling out of control with possible suicidal plan.   MEDICATIONS:  Previously on Effexor and Trileptal.   ALLERGIES:  No known drug allergies.   PHYSICAL EXAMINATION:  Essentially within normal limits.  Neurologically  nonfocal.   LABORATORY DATA:  Routine admission labs essentially within normal limits.   MENTAL STATUS EXAM:  Tall, healthy, cooperative female.  Speech normal.  Mood depressed.  Thought processes goal directed.  Thought content positive  for suicidal ideation, contracting for safety.  No psychotic symptoms.  Cognitively intact.   ADMISSION DIAGNOSES:   AXIS I:  1. Depression not otherwise specified.  2. History of polysubstance abuse.   AXIS II:  None.   AXIS III:  Chronic back pain secondary to degenerative joint disease.   AXIS IV:  Moderate (problems with housing and medical problems).   AXIS V:  42/60.   HOSPITAL COURSE:  The patient was admitted and ordered routine p.r.n.  medications and underwent further monitoring.  She was restarted on previous  medications.  She responded to Trileptal and Effexor.  The patient tolerated  medication changes and Strattera was added to treat ADD-like symptoms and  residual depressive symptoms.  The patient tolerated medication changes  well.   CONDITION ON DISCHARGE:  Markedly improved.  Family meeting was held and  patient was confronted regarding  compliance with follow-up and taking  medications.  The patient was agreeable.  The patient was discharged, again,  with no dangerous ideation or psychotic symptoms.  Reporting motivation to  be compliant with the follow-up plan.   DISCHARGE MEDICATIONS:  1. Trileptal 150 mg q.a.m. and 2 q.h.s.  2. Strattera 40 mg q.a.m.  3. Wellbutrin SR 150 mg q.a.m. and 2 p.m.  4. Ultram 50 mg q.8h. p.r.n. pain.  5. Motrin 800 mg q.6h. p.r.n.   FOLLOW UP:  Dr. Valente David on August 19, 2002 at 11:30 a.m. and [facility] for therapy.   DISCHARGE DIAGNOSES:   AXIS I:  1. Depression not otherwise specified.  2. History of polysubstance abuse.   AXIS II:  None.   AXIS III:  Chronic back pain secondary to degenerative joint disease.   AXIS IV:  Moderate (problems with housing and medical problems).   AXIS V:  Global Assessment of Functioning on discharge 55.  Jeanice Lim, M.D.    JEM/MEDQ  D:  09/09/2002  T:  09/11/2002  Job:  [id number]

## 2011-04-26 NOTE — Discharge Summary (Signed)
[name], [name] NO.:  [id number]   MEDICAL RECORD NO.:  [id number]          PATIENT TYPE:  IPS   LOCATION:  0300                          [facility]ACILITY:  [facility]   PHYSICIAN:  [name], M.D. DATE OF BIRTH:  1976-08-27   DATE OF ADMISSION:  11/25/2005  DATE OF DISCHARGE:  11/28/2005                                 DISCHARGE SUMMARY   IDENTIFYING DATA:  This is a 35 year old single Caucasian female voluntarily  admitted.  Presenting with a history of cocaine abuse, snorting and smoking  since age 42.  Currently homeless.  Her mother has children.  Feeling  depressed and suicidal without a specific plan at the moment.  Off  medications for one week.  Wanted to go to a rehab program.  On probation.  Use had been reported to DSS.   PAST PSYCHIATRIC HISTORY:  Had been here in May of 2006.  History of bipolar  disorder.   MEDICATIONS:  Wellbutrin and Adderall.   ALLERGIES:  No known drug allergies.   PHYSICAL EXAMINATION:  Physical and neurologic exam essentially within  normal limits except for obesity.   MENTAL STATUS EXAM:  Alert, cooperative.  Fair eye contact.  Speech clear,  depressed.  Affect flat.  Thought processes goal directed.  Cognitively  intact.  Judgment and insight were fair to poor with poor impulse control.   ADMISSION DIAGNOSES:  AXIS I:  Bipolar disorder.  Cocaine dependence.  AXIS II:  Deferred.  AXIS III:  None.  AXIS IV:  Moderate (stressors with primary support [name], housing problems,  problems related to legal system and psychosocial issues).  AXIS V:  30/55-60.   HOSPITAL COURSE:  The patient was admitted and ordered routine p.r.n.  medications and underwent further monitoring.  Was encouraged to participate  in individual, [name] and milieu therapy.  The patient worked on getting into  an [name].  Was quite motivated, proactively securing a place despite  some challenges.  Her mood became less labile with decreased  suicidal  ideation and then resolution and improvement in mood.   CONDITION ON DISCHARGE:  She was discharged in improved condition with no  side effects.  Sleeping and eating well.  No dangerous ideation.  More  hopeful.  Motivated to remain abstinent and compliant with the aftercare  plan, at least verbally and admitting the severity of her addiction as well  as her mood disorder.  The patient was discharged after medication  education.   DISCHARGE MEDICATIONS:  1.  Wellbutrin XL 300 mg q.a.m. targeting depressive symptoms and ADD.  2.  Equetro 100 mg q.a.m. and 3 at 8 p.m. for mood instability, mixed      symptoms, history of rage.   FOLLOW UP:  The patient was to follow up with Dr. Evelene Croon on Thursday, December 05, 2005 and [name] at the [name] on Thursday, December 12, 2005 at  1:30 p.m.   DISCHARGE DIAGNOSES:  AXIS I:  Bipolar disorder.  Cocaine dependence.  AXIS II:  Deferred.  AXIS III:  None.  AXIS IV:  Moderate (stressors with  primary support [name], housing problems,  problems related to legal system and psychosocial issues).  AXIS V:  GAF on discharge 55-60.      [name], M.D.  Electronically Signed     JEM/MEDQ  D:  12/10/2005  T:  12/11/2005  Job:  [id number]

## 2011-04-26 NOTE — H&P (Signed)
NAME:  [name], [name]                        ACCOUNT NO.:  [id number]   MEDICAL RECORD NO.:  [id number]                   PATIENT TYPE:  IPS   LOCATION:  0301                                 FACILITY:  [facility]   PHYSICIAN:  Jeanice Lim, M.D.              DATE OF BIRTH:  [birth year]   DATE OF ADMISSION:  06/15/2004  DATE OF DISCHARGE:  06/20/2004                         PSYCHIATRIC ADMISSION ASSESSMENT   IDENTIFYING INFORMATION:  This is a 35 year old single white female who is a  voluntary admission.   HISTORY OF PRESENT ILLNESS:  This single mother of three children presented  in the emergency department with agitation and thoughts of overdosing on her  medications.  She had relapsed on cocaine yesterday after 60 days clean.  She feels like a failure.  She also endorses a lot of irritability, feeling  that she just wants to kick the shit out of her irritating housemates, who  she believes intentionally trying to irritate her with various behaviors.  She says she has a roommate that makes compulsive clicking noises with her  mouth, which is very irritating to her and keeps her from sleeping at night.  The patient reports that she has been missing some doses of her mood  stabilizer, Trileptal, over the past couple of weeks.  She endorses mood  swings with an episode of severe depression prior to feeling extremely  irritable with her roommates.  She has also experienced some racing thoughts  and is unable to settle down and get to sleep readily at night.  She  relapsed on cocaine yesterday, using one time.  She endorses thoughts of  suicide with no specific plan.  She does have a history of prior overdoses.   PAST PSYCHIATRIC HISTORY:  The patient is followed by Dr. Hortencia Pilar at  [facility] and also is being seen at the [facility]  three times a week.  Since last discharge, she has been living at [facility] and  [facility], which is a recovery house for addicts.   This is her sixth  admission to [facility] with her last discharge in  September of 2004.  She has a history of borderline personality disorder  with antisocial traits and bipolar disorder.  In the past, she has taken  Depakote, which caused weight gain and which she would like to avoid.  She  says she cannot take lithium for reasons that are not clear and has taken  Risperdal in the past, which she feels that she responded well to.   SOCIAL HISTORY:  The patient is a single female with one 79-year-old child  that is being cared for by his father and 34-year-old twin girls, which are  being cared for by the patient's mother.  She is currently on four years  probation for common-law robbery.  She is currently living at [facility] and  [facility].   FAMILY HISTORY:  Multiple family members with a history of substance abuse.   ALCOHOL/DRUG HISTORY:  The patient has a history of cocaine and alcohol  abuse with her longest period being clean and sober approximately one year.  She denies any current use of alcohol.   MEDICAL HISTORY:  The patient is followed by Dr. Adonis Housekeeper at [facility].  Medical problems include a history of acid reflux and some  chronic back pain and asthma.  Past medical history is remarkable for no  history of seizures.  Surgical interventions include having a cerclage with  her pregnancy.   CURRENT MEDICATIONS:  Trileptal 150 mg p.o. q.a.m., 450 mg p.o. q.h.s.,  Wellbutrin XL 300 mg p.o. q.a.m.  Those are her only current medications.   ALLERGIES:  None.   POSITIVE PHYSICAL FINDINGS:  The patient's full physical examination was  done in the emergency department and was generally without current findings.  Physical examination, today, reveals the patient's vital signs are within  normal limits.  At the time of admission, temperature 97.9, pulse 75,  respirations 17, blood pressure 117/97.  She is 6 feet 1 inch tall and 261  pounds.   Large-built female with good hygiene.  Physical examination is  generally negative and consistent with ER findings except we do find that  she has some scattered, fine wheeze throughout her chest and she is  complaining of subjectively feeling some tightness.  Neurological exam was  nonfocal.   REVIEW OF SYSTEMS:  The patient has complaint of some nasal congestion and  cough without any fever.   LABORATORY DATA:  Urine pregnancy test was negative.  Urine drug screen was  positive for cocaine.  Her metabolic panel was within normal limits with  normal liver and renal parameters.  Urinalysis was within normal limits.  CBC normal.   MENTAL STATUS EXAM:  This is a fully alert female who is pleasant and  cooperative with a labile affect.  Speech is increased in amount with some  mild pressure.  She is somewhat hyperverbal.  Mood is labile and irritable.  Thought process is remarkable for vague paranoia with some thought  agitation.  Positive for both suicidal and homicidal thoughts.  As noted  above, still wants to harm her roommates and feels life is not worth living.  Cognitively, she is intact and oriented x 3.  Intelligence is within normal  limits.  Impulse control and judgment impaired.  Insight fair to poor.   DIAGNOSES:   AXIS I:  1. Schizoaffective disorder, bipolar-type.  2. Polysubstance abuse, in partial remission.  3. Cocaine abuse.   AXIS II:  Borderline personality disorder with antisocial traits.   AXIS III:  Rhinosinusitis.   AXIS IV:  Severe (problems with social environment and relating to peers;  having a supportive mother and a stable home situation is an asset to her).   AXIS V:  Current 24; past year 61.   PLAN:  Voluntarily admit the patient with 15-minute checks in place in order  to stabilize her mood and to alleviate her suicidal and homicidal thoughts. We are going to continue her current medications and add Risperdal 0.25 mg  p.o. at noon now and  q.6h. p.r.n. for agitation and 0.5 mg p.o. q.h.s.  We  will plan on having her follow up with the [facility].  We will also  place her on Humibid L.A. 1 tab p.o. b.i.d. and an albuterol inhaler 2 puffs  b.i.d. x 3 days to help clear  up some of her congestion and chest tightness.   ESTIMATED LENGTH OF STAY:  Five days.     Margaret A. Stephannie Peters                   Jeanice Lim, M.D.    MAS/MEDQ  D:  06/20/2004  T:  06/20/2004  Job:  [id number]

## 2011-04-26 NOTE — H&P (Signed)
NAMEKEAUNNA, [name]              ACCOUNT NO.:  [id number]   MEDICAL RECORD NO.:  [id number]          PATIENT TYPE:  IPS   LOCATION:  0508                          FACILITY:  [facility]   PHYSICIAN:  Geoffery Lyons, M.D.      DATE OF BIRTH:  [birth year]   DATE OF ADMISSION:  03/31/2005  DATE OF DISCHARGE:                         PSYCHIATRIC ADMISSION ASSESSMENT   IDENTIFYING INFORMATION:  Ms. [name] is a 35 year old single white female  who was admitted voluntarily to the [facility] on March 31, 2005.   HISTORY OF PRESENT ILLNESS:  Ms. [name] is a patient well known to the  [facility] who has history of schizoaffective disorder,  bipolar type, who presented to the [facility] after a five-day  binge on crack cocaine, marijuana, and alcohol. She admits that she has been  off her medications for five days. She denies any suicidal or homicidal  ideation. She denies any auditory hallucinations, but she reports seeing  lights while on crack cocaine and alcohol. She denies any mania.   PAST PSYCHIATRIC HISTORY:  Ms. [name] has been inpatient at [facility], last time June of 2005. She was also here three other times  in 2005 and once in 2003. She attended psychiatric intensive outpatient  program at the [facility] in July of 2003. She has also been  inpatient at [facility]. She is followed by Dr. Lafayette [name] as an  outpatient and sees [facility] as a counselor.   SOCIAL HISTORY:  Ms. [name] lives with her mother and her two kids in  [place]. She is in school at [employer]. She has a GED. She  describes her family as being her social support, and she denies any legal  problems at this time. She has three children, one of whom lives with the  child's father.   FAMILY HISTORY:  She reports that both her mother and father are substance  abusers. Her alcohol and drug history, she has been using cocaine,  marijuana, and alcohol since age 55, and she admits to using approximately  every two months.   PRIMARY CARE PHYSICIAN:  Her primary care [name] is Hotel manager.   PAST MEDICAL HISTORY:  1.  Degenerative disk disease.  2.  Obesity.   MEDICATIONS:  1.  Wellbutrin XL 300 mg daily.  2.  Tegretol 200 mg 3 at bedtime.  3.  She also is prescribed two different ADHD medications, Adderall XR 25 mg      which she had filled on March 22 at [facility] in [place] on [address], and she is also prescribed Focalin XR 20 mg that she had filled      on April 3 at [facility] on [address] in [place].   DRUG ALLERGIES:  No known drug allergies.   PHYSICAL EXAMINATION:  Physical exam was performed at [facility] emergency  department. At the [facility], her vital signs were:  Temperature 97.8, pulse 82, respirations 18, blood pressure 127/87.   LABORATORY DATA:  Basic metabolic panel  was within normal limits. Her urine  drug screen was positive for amphetamines, cocaine, and marijuana. Her  alcohol level was 7.   MENTAL STATUS EXAM:  She was alert and oriented x4. She had fair eye contact  with an appropriate affect. Her appearance was casual. Her behavioral, she  had some mild hostility, but she was cooperative. Her speech was clear with  an even tone and pace. Her mood was irritable with some hostility displayed.  Her thought process was coherent. She had no suicidal or homicidal ideation.  No psychosis or mania was observed during the interview. Her cognition  function, her concentration was normal. Her memory was intact. Her insight  is  poor, and her impulse control is poor.   DIAGNOSES:   AXIS I:  1.  Schizoaffective disorder, bipolar type.  2.  Polysubstance abuse.   AXIS II:  Borderline personality disorder per the chart.   AXIS III:  1.  Degenerative disk disease.  2.  Obesity.   AXIS IV:  Mild.   AXIS V:  Current global assessment of  functioning 34 with the past year  range of 60 to 65.   PLAN:  Preliminary plan is to admit the patient voluntarily and resume her  medications of Wellbutrin and Tegretol. She will be given Symmetrel for  cocaine withdrawal. We will work to increase her coping skills and decrease  stressors. We will try to have a family session with the patient's mother,  and she will follow up with Dr. Park [name]. Tentative length of stay is four to  seven days.      AHW/MEDQ  D:  04/03/2005  T:  04/03/2005  Job:  [id number]

## 2011-04-26 NOTE — Discharge Summary (Signed)
[facility]  Patient:    [name], [name]                     MRN: [id number] Adm. Date:  [id number] Disc. Date: 03/04/01 Attending:  Antionette Char Dictator:   Ocie Doyne, M.D. CC:         [name], M.D.  Dr. Hortencia Pilar; Mental Health   Discharge Summary  DISCHARGE DIAGNOSES:          1. Intrauterine pregnancy of twins at [redacted] weeks                                  gestation.                               2. Premature cervical changes.                               3. Status post cerclage.                               4. Bipolar disorder.                               5. Depression.                               6. History of polysubstance abuse.  DISCHARGE MEDICATIONS:        1. Procardia XL 60 mg p.o. q.d.                               2. Colace 200 mg p.o. q.d.                               3. Prenatal vitamins p.o. q.d.                               4. Milk of Magnesia 30 ml q.a.c. and q.h.s.                                  p.r.n. reflux.  HISTORY AND PHYSICAL:         This 35 year old G7, P1-0-5-1 with uncertain LMP and EDC of May 23, 2001 at [redacted] weeks gestation by an ultrasound at 18 weeks had previously been treated for incompetent cervix with a cerclage.  She presented with premature cervical dilatation and effacement visualized on ultrasound the day of admission with the internal os dilated to 4 cm.  She also complained of increased mucus discharge from the vagina, rare uterine contractions, increased lower abdominal pressure.  She is known to be GBS positive.  PAST OBSTETRICAL HISTORY:     Significant for TABs, one SAB, and one uncomplicated spontaneous vaginal delivery at term in 1997.  PAST MEDICAL HISTORY:         Additionally, the patient has a history of bipolar disorder, depression, and a remote  history of substance abuse.  She reports that she has not been to mental health since December 2001, has not been  taking any medications because of concern that they would interfere with her pregnancy, and that her bipolar disorder has been refractory to many medications.  PHYSICAL EXAMINATION  VITAL SIGNS:                  Vital signs stable.  PELVIC:                       Vaginal examination:  A cerclage suture was visible.  No membranes were visualized.  She had increased lower uterine segment development on sterile vaginal examination.  External os was fingertip dilated with increased pressure.  LABORATORIES:                 Ultrasound showing twin gestation, ______, twin A cephalic presentation, twin B breech, amniotic fluid within normal limits for [redacted] weeks gestation for both twins, concordant growth, and internal os of the cervix open to 4 cm.                                CBC:  White blood cells 15.8, hemoglobin 10.6, hematocrit 30.1, platelets 267,000.  Blood type O-.  Antibody negative.  RPR nonreactive.  Urinalysis was unremarkable.  A complete metabolic panel was essentially normal.  HOSPITAL COURSE:              Patient was admitted for premature cervical changes, placed on bed rest in Trendelenburg position.  She received betamethasone x 2, RhoGAM per protocol, and was tocolyzed on magnesium sulfate.  She continued to complain of abdominal cramping, although there was no uterine activity detected on tocometry.  A vaginal examination revealed no cervical changes.  She received a single dose of subcutaneous terbutaline following which she had no uterine contractions.  Additionally, because of her positive group B strep culture she was started on IV Unasyn.  Over the next two days patient remained on bed rest, on magnesium sulfate tocolysis.  Her magnesium levels were somewhat subtherapeutic at 4.7; however, no uterine activity was detected.  On March 23 she complained of chest pain worse with movement.  Lung sounds were good.  Respiratory rate was 18.  Oxygen saturation was 97%.   A chest film showed mild pulmonary congestion, but no edema.  Her examination was most consistent with musculoskeletal pain versus gastroesophageal reflux.  She was started on p.o. Protonix and Milk of Magnesia and her pain resolved immediately.  On March 01, 2001 a conference was held with the patient and family members who had multiple concerns.  The patient stated at that time that she had not been seen by a physician in three days and that she had been told that medical personnel did not care about her and were planning to take the babies and let her die.  Her chart were reviewed and six physician visits had been documented during that three day interval. Additionally, it was reinforced to the patient that her entire plan of care was designed to optimize the health of both herself and her gestation. Additionally, the patient was advised that although she was free to sign out against medical advice, she was strongly recommended to stay for the well-being of her babies.  She stated at that time that she would prefer to deliver so that she would not have to  continue on bed rest and the following day an NICU tour was scheduled for the patient to review the potentially harmful consequences of prematurity and adverse outcomes which we are trying to prevent.  Patients mother and maternal aunt confirmed that she has bipolar disorder, attention deficit hyperactivity disorder, and seems more depressed than usual.  She had been followed by a psychiatrist, Dr. Hortencia Pilar, at mental health, but had not seen him since December 2001 and was on no medications.  A psychiatry consult was obtained.  The patient was evaluated by Dr. Jeanie Sewer on March 03, 2001.  His assessment included bipolar 2 disorder and depression, both in remission, polysubstance abuse in remission, a global assessment of function of 60, and for treatment he recommended that she be restarted on Lithium following delivery possibly with  the addition of Prozac if she  remained depressed.  The patient contracted with Dr. Jeanie Sewer to return to mental health for ongoing care and she contracted for safety and agreed to contact emergency services for feelings of self harm or symptoms of mania.  At the time of discharge she is not felt to be a threat to herself or others. Her magnesium was discontinued on March 03, 2001 and she was started on Procardia XL which was well tolerated.  She continued to have no uterine contractions and the patients mother will be staying with the patient to help with child care and meals at home to allow the patient to remain on strict bed rest.  At the time of discharge she has completed seven days of IV Unasyn for positive group B strep.  She has had no uterine contractions during the seven days that she has been hospitalized.  Fetal heart rate remains reassuring; however, the patient is refusing monitoring and continues to request to be out of bed.  Patients mother indicates an understanding that strict bed rest is essential to optimize the well-being of the patients twin gestation.  She is discharged home in stable condition.  Followup will be at maternity admissions with [facility] for high risk prenatal care on Tuesday, April 2 at 3 p.m. She will return to mental health to see Dr. Abbe Amsterdam on Monday, April 1 at 2:30 p.m. DD:  03/04/01 TD:  03/04/01 Job: [id number]

## 2011-04-26 NOTE — Op Note (Signed)
[facility]  Patient:    DOLORA, RIDGELY                     MRN: [id number] Proc. Date: 01/01/01 Adm. Date:  [id number] Attending:  Michaelle Copas Dictator:   LJM CC:         [facility], outpatient clinic   Operative Report  PREOPERATIVE DIAGNOSIS:       A 19+ week twin gestation, cervical weakness.  POSTOPERATIVE DIAGNOSIS:      A 19+ week twin gestation, cervical weakness.  PROCEDURE:                    [employer] cerclage.  SURGEON:                      Roseanna Rainbow, M.D.  ANESTHESIA:                   Spinal.  COMPLICATIONS:                None.  ESTIMATED BLOOD LOSS:         Less than 30 cc.  FINDINGS:                     Exam of the cervix prior to placing the suture was slightly shortened, closed.  DESCRIPTION OF PROCEDURE:     The patient was taken to the operating room, placed in the dorsal lithotomy position, and prepped and draped in the usual sterile fashion after spinal anesthetic had been administered without difficulty. A weighted speculum and Deavers were then placed into the vagina for retraction. A sponge forceps was then used to grasp the cervix and repeated pursestring sutures were placed in four to six bites around the cervix at the level of the internal os. This suture was then tied. Please note that a large caliber double silk was the type of suture used. After the suture was tied and cut, the vagina was then irrigated with a solution containing clindamycin. The patient tolerated the procedure well. At the close of the procedure, the instrument and pack counts were said to be correct x 2. The patient was taken to the PACU awake and in stable condition. DD:  01/01/01 TD:  01/01/01 Job: [id number]

## 2011-04-26 NOTE — H&P (Signed)
NAME:  [name], [name]                        ACCOUNT NO.:  [id number]   MEDICAL RECORD NO.:  [id number]                   PATIENT TYPE:  IPS   LOCATION:  0301                                 FACILITY:  [facility]   PHYSICIAN:  Jeanice Lim, M.D.              DATE OF BIRTH:  1976/04/15   DATE OF ADMISSION:  06/15/2004  DATE OF DISCHARGE:  06/20/2004                         PSYCHIATRIC ADMISSION ASSESSMENT   IDENTIFYING INFORMATION:  This is a 35 year old single white female who is a  voluntary admission.   HISTORY OF PRESENT ILLNESS:  This single mother of 3 children presented in  the emergency department complaining of agitation and thoughts of overdosing  on her medications.  She had relapsed on cocaine one day prior and used  cocaine once after a period of 60 days clean, while living at a halfway  house for substance abusers.  As soon as she used the cocaine she felt like  a terrible failure, felt suicidal and ashamed of herself.  She endorses 2-3  weeks of irritability preceding the relapse on cocaine, finding that small  behaviors by her roommate were creating increasing irritability for her.  She was unable to shut these behaviors out, focus herself, sleep well at  night and had a nagging desire to kick the shit out of her roommate.  She  endorses some variable compliance with her medications, especially her  Trileptal, and endorsed mood swings, irritability and some racing thoughts.  Not sleeping at night was a major problem with initial insomnia and sleep  decreased to 4 hours per night for the past 3 weeks.  She also endorsed some  mood swings, with severe depression for 2 or 3 days preceding her days of  irritability.   PAST PSYCHIATRIC HISTORY:  The patient is followed by Dr. Hortencia Pilar at  [facility] and by the [facility] for counseling 3  times a week.  This is the patient's 6th admission to [facility], with her last one being  several months ago.  She has been  previously diagnosed with bipolar disorder and borderline personality  disorder with antisocial traits.  She has a history of weight gain with  Depakote and says she cannot take lithium although she is not clear on what  her past reaction has been.  She has reported a previous good response when  taking Risperdal adjunctively and was taken off this since her last  admission here by mental health but does not know why.   SOCIAL HISTORY:  The patient is a single mother of 44 year old twin girls and  a 32-year-old son who are being cared for by her mother.  She is currently  living at [facility] and [employer], which is a halfway house for substance  abusers.  Current legal charges involve 4 years' probation for common law  robbery.  She has a basic education.   FAMILY  HISTORY:  Remarkable for multiple family members with a history of  substance abuse.   ALCOHOL AND DRUG HISTORY:  The patient has a history of cocaine and alcohol  abuse, with her longest period being clean and sober 1 year.   PAST MEDICAL HISTORY:  The patient is followed by Dr. Adonis Housekeeper at [facility].  Medical problems are history of  acid reflux and some chronic back pain from generalized muscle strain and  the patient has a history of asthma.  Past medical history is remarkable for  no seizures, blackouts or memory loss.  Only surgical procedure has been a  history of a cerclage during her pregnancy.   REVIEW OF SYSTEMS:  Today a review of systems is remarkable for some nasal  congestion, cough without fever.   POSITIVE PHYSICAL FINDINGS:  Physical examination revealed a well-nourished,  well-developed, large-build female.  She is 6 feet 1 inch tall, weighed 261  pound.  Her full physical examination was done in the emergency department  and we will refer to that.  She generally has a normal motor exam with no  focal findings.  Vital signs on  admission: Temperature 97.9, pulse 75,  respirations 17, blood pressure 117/97.   DIAGNOSTIC STUDIES:  Her urine pregnancy test was negative.  Her urine drug  screen was positive for cocaine.  CBC and metabolic panel were within normal  limits, including normal liver enzymes and normal kidney parameters, renal  function parameters.   CURRENT MEDICATIONS:  1. Trileptal 150 mg p.o. q.a.m. and 450 mg p.o. q.h.s.  2. Wellbutrin XL 300 mg q.a.m.   DRUG ALLERGIES:  None.   MENTAL STATUS EXAM:  This is a fully alert female who is pleasant and  cooperative, with a labile affect.  Her speech is somewhat hyperverbal,  increased in amount.  She displays mild pressure.  Irritability varies  throughout the interview.  Mood is labile and irritable.  Thought process  reveals vague paranoia, feeling everyone is against her where she lives and  is unwilling to help her, that they are out to get her and are doing  habitual behaviors just to get on her nerves.  These behaviors involve her  roommate clicking her tongue or making odd noises with her mouth, for  example.  The patient's thoughts are agitated today, positive for suicidal  ideation and some clear homicidal ideation, obsessing about wanting to kick  or harm her roommate.  Cognitively she is intact and oriented x3.  Intelligence is within normal limits.  Impulse control and judgment  impaired.  Insight fair to poor.   ADMISSION DIAGNOSIS:   AXIS I:  1. Schizoaffective disorder, bipolar type.  2. Cocaine abuse.  3. History of polysubstance abuse.   AXIS II:  Borderline personality disorder with antisocial traits.   AXIS III:  No diagnosis.   AXIS IV:  Severe problems with the social environment.  Having a supportive  mother and a stable home situation is an asset to her.   AXIS V:  Current 24, past year 38.   INITIAL PLAN OF CARE:  Voluntarily admit the patient with q.15 minute checks in place to stabilize her mood and to alleviate  her suicidal and homicidal  thoughts.  We are going to add Risperdal 0.25 mg q.noon now and q.6 hours  p.r.n. for agitation, and Risperdal 0.5 mg p.o. q.h.s.  We will continue her  previous medications and she will continue counseling with the [facility]  after discharge.   ESTIMATED LENGTH OF STAY:  6 days.     Margaret A. Stephannie Peters                   Jeanice Lim, M.D.    MAS/MEDQ  D:  06/27/2004  T:  06/27/2004  Job:  [id number]
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
## 2011-04-26 NOTE — Discharge Summary (Signed)
[facility]  Patient:    [name], [name]                     MRN: [id number] Adm. Date:  [id number] Disc. Date: [id number] Attending:  Michaelle Copas Dictator:   Jamey Reas, M.D.                           Discharge Summary  DATE OF BIRTH:                1976-07-11  ADMISSION DIAGNOSES:          1. Preterm contractions, preterm labor.                               2. Questionable weak cervix.                               3. A 19-week intrauterine pregnancy with twins.  DISCHARGE DIAGNOSES:          1. Preterm contractions, preterm labor.                               2. Questionable weak cervix.                               3. A 19-week intrauterine pregnancy with twins.                               4. Status post cerclage.  PROCEDURE:                    Cerclage placed on January 01, 2001 by                               Dr. Antionette Char.  HOSPITAL COURSE:              The patient was admitted on December 30, 2000, after being evaluated by Dr. Gavin Potters, for a consultation regarding cerclage placement. She was noted to have a cervix 1 cm external os that was shortened and dynamic on ultrasound. She had a history of GBS positive with prior pregnancy. She was admitted for bed rest, Unasyn. She was complaining of mild cramping on admission but no significant contractions. She was denying any leakage of fluid. The patient was admitted, kept on bed rest throughout hospitalization. Her cervix was 2.4 down to 2.0 cm and dynamic. She was kept on bed rest throughout hospitalization. She was treated with IV Unasyn 3 g IV q.6h., IV fluid, Motrin 800 mg p.o. initially, then 400 q.4h. throughout the remainder of her hospitalization. The patient received antibiotics x 2 days. On day two of admission, she was taken to the operating room where a cerclage was placed by Dr. Tamela Oddi. The patient tolerated the procedure well. she remained  on bed rest throughout hospitalization.  DISPOSITION:                  She will be discharged to home due to abnormal social circumstances after receiving only one day of IV Unasyn post-cerclage. She will get two  days of IV Unasyn at home per home health. The patient will take one more day of Motrin as well.  CONDITION ON DISCHARGE:       Good.                  FOLLOWUP:                     She will followup with Dr. Corky Sox at [facility] Admissions on Tuesday, January 06, 2001 at 3 p.m.  DISCHARGE INSTRUCTIONS:       The patient was given preterm labor precautions and was asked to stay on bed rest as much as possible. DD:  01/02/01 TD:  01/02/01 Job: [id number]

## 2011-04-26 NOTE — Discharge Summary (Signed)
NAME:  [name], [name] NO.:  [id number]   MEDICAL RECORD NO.:  [id number]                   PATIENT TYPE:  IPS   LOCATION:  0303                                 FACILITY:  [facility]   PHYSICIAN:  Jeanice Lim, M.D.              DATE OF BIRTH:  05-28-1976   DATE OF ADMISSION:  03/10/2004  DATE OF DISCHARGE:  03/17/2004                                 DISCHARGE SUMMARY   IDENTIFYING DATA:  This is a voluntary admission of a 35 year old single  Caucasian female, suicidal for the past week, had been using crack on the  day prior to admission, reported medications are not working, sleeping all  day, staying in bed, reports Wellbutrin used to give her energy or  something.  She has not been taking her prescriptions as prescribed.  Admitted November 24 and discharged on the 29th.  Told nursing staff on  admission, she cannot remember what she had been doing, was irritable,  endorsed fleeting suicidal thoughts and some paranoid ideation.  Has been  followed at [facility], attends NA and  receives therapy at [facility].  She has been prescribed Wellbutrin,  Trileptal, Risperdal, and Percocet, but has not taken medication for the  last month.  She had a history of cocaine and alcohol abuse.   MEDICATIONS:  Last discharged, as above on Trileptal, Wellbutrin, Risperdal,  and trazodone.   ALLERGIES:  NO KNOWN DRUG ALLERGIES.   PHYSICAL EXAMINATION:  Essentially within normal limits.  NEURO:  Nonfocal.   ROUTINE ADMITTED LABORATORY STUDIES:  Alcohol level less than 5.  CBC within  normal limits.  CMET within normal limits.  TSH 1.45, other within normal  limits.   ADMISSION DIAGNOSIS:   AXIS I:  1. History of schizoaffective disorder, bipolar type.  2. Polysubstance abuse.  3. Cocaine dependency.  4. Alcohol dependency.   AXIS II:  Borderline personality disorder, as well as antisocial traits.   AXIS III:  1. History of  acid reflux.  2. Back pain.   AXIS IV:  Severe problems with primary support group, related to social,  environment, occupational, housing, economic, and problems with the legal  systems, currently on intensive probation program.   AXIS V:  35/55.   The patient was admitted, ordered routine and p.r.n. medications, underwent  further monitoring, was encouraged to participate in individual, group, and  milieu therapies.  The patient was started on Abilify initially and  Wellbutrin.  These were DC'd and Risperdal was optimized for paranoid  thinking.  Cymbalta was started for clear depressive symptoms and anxiety.  Symmetrel ordered for cocaine craving, and Trileptal was optimized for mood  stabilization.  The patient reported a positive response to clinical  intervention, participating in substance abuse therapy as well as aftercare  planning.  Condition on discharge was markedly improved.  Mood was more  stable.  Affect brighter.  Improved judgment  and insight.  No withdrawal  symptoms.  No dangerous ideation.  The patient reported motivation to be  compliant with the aftercare plan, aware of the seriousness of her addiction  and the impact on her mood and safety.   The patient was given medication eduction and discharged on:  1. Cymbalta 30 mg once a day.  2. Symmetrel 100 mg b.i.d.  3. Trileptal 150 mg q. a.m. q. 4:00 p.m., and 3 q. 8:00 p.m.  4. Trazodone 100 mg q. 8:00 p.m. p.r.n. insomnia.  5. Risperdal 0.25 mg 3 q.h.s.   The patient was discharged to followup with [facility] with Dr. Lang Snow March 21, 2004, at 2:00 and Amanda Pea March 19, 2004  at 12:30, Dual Diagnosis Program.   DISCHARGE DIAGNOSES:  1. History of schizoaffective disorder, bipolar type.  2. Polysubstance abuse.  3. Cocaine dependency.  4. Alcohol dependency.   AXIS II:  Borderline personality disorder, as well as antisocial traits.   AXIS III:  1. History of acid reflux.  2.  Back pain.   AXIS IV:  Severe problems with primary support group, related to social,  environment, occupational, housing, economic, and problems with the legal  systems, currently on intensive probation program.   AXIS V:  Global assessment of functioning on discharge was 55.                                               Jeanice Lim, M.D.    JEM/MEDQ  D:  04/22/2004  T:  04/23/2004  Job:  [id number]

## 2011-04-26 NOTE — Discharge Summary (Signed)
[facility]  Patient:    [name], [name]                     MRN: [id number] Adm. Date:  [id number] Disc. Date: [id number] Attending:  Tammi Sou Dictator:   Marta Lamas, M.D.                           Discharge Summary  ADMISSION DIAGNOSES:          1. Preterm labor at [redacted] weeks gestation.                               2. Twin gestation.  DISCHARGE DIAGNOSES:          1. Preterm labor at [redacted] weeks gestation.                               2. Twin gestation.  HISTORY OF PRESENT ILLNESS:   Patient was admitted for preterm labor at 24 weeks with twin gestation.  Patient came in with complaints of cramping and uterine contractions.  See H&P for details.  HOSPITAL COURSE:              Patient was admitted at [redacted] weeks gestation for preterm labor.  Patient was noted to have uterine contractions upon admission. Patient also was noted to have a cerclage in place.  Patient also had a history of substance abuse, bipolar disease, a history of GBS.  Upon admission patient had workup to rule out infection.  Patients white count, vaginal cultures, UA, and urine cultures were all within normal limits and cultures were negative.  Patients RPR was nonreactive.  Patient had ultrasound as well for cervical length and growth which noted a dynamic cervix changed from 3.7 to 2 cm with contractions.  Patients AFI was normal for gestational age with posterior placenta and baby A was in vertex position and baby B was breech position.  Fetal heart rate was normal upon initial ultrasound and throughout the entire hospital course.                                Patient upon admission was started on IV antibiotic Unasyn and Motrin 600 mg q.6h. and IV fluids to try to prevent labor.  Patient also was treated for a yeast infection that was noted on wet prep with Diflucan antifungal cream.                                Patients uterine contractions resolved with  the above measures and patient remained stable throughout the entire hospital course.  Patient received external fetal monitoring with tocometry throughout which did not reveal any uterine contractions.  Both fetuses had normal heart rates throughout the entire hospital course.  Patient was hospitalized for a total of seven days and received antibiotics throughout.                                After remaining stable throughout hospital course without any signs of labor, patient will be discharged today. Patients examination is within normal limits.  Patient will be discharged home on  strict bed rest in the care of her mother who is coming in town to take care of patient.  Patient will receive phone calls biweekly from [facility]. Patient has a followup appointment scheduled at [facility] clinic.  DISCHARGE INSTRUCTIONS:       Patient given discharge instructions of strict bed rest with bathroom privileges only.  Patient also instructed not to engage in any sexual activity or any activity that could possibly stimulate labor.  DISCHARGE FOLLOW-UP:          Patient is to follow-up with high risk clinic on February 18, 2001 at 8:30 a.m.  Patient will also receive biweekly phone calls from [facility].  DISCHARGE DIET:               Regular.  DISCHARGE MEDICATIONS:        Prenatal vitamins one tablet p.o. q.d.  DISPOSITION:                  Home with mother. DD:  02/10/01 TD:  02/10/01 Job: [id number]

## 2011-04-26 NOTE — Discharge Summary (Signed)
[facility]  Patient:    [name], [name]                     MRN: [id number] Adm. Date:  [id number] Disc. Date: [id number] Attending:  Michaelle Copas Dictator:   Jarome Lamas, M.D.                           Discharge Summary  DATE OF BIRTH:  05-06-1976  ADMISSION DIAGNOSES: 1. Twin intrauterine pregnancy at [redacted] weeks gestation. 2. Premature prolonged rupture of membranes, twin "A." 3. Multiple psychiatric disorders.  DISCHARGE DIAGNOSES: 1. Twin intrauterine pregnancy at [redacted] weeks gestation. 2. Premature prolonged rupture of membranes, twin "A." 3. Multiple psychiatric disorders. 4. Postoperative day #3, status post normal spontaneous vaginal delivery of    viable female. 5. Postoperative day #3, status post low-transverse cesarean section and    bilateral tubal ligation, viable female.  REFERRING PHYSICIAN:  OB Teaching Service.  CONSULTS:  None.  PROCEDURES:  The patient had a normal spontaneous vaginal delivery on March 24, 2001, as well as a low-transverse cesarean section and bilateral tubal ligation the same day.  HOSPITAL COURSE:  A 35 year old G3, P1-1-5-3, postoperative day #3, status post low-transverse cesarean section and BTL on postpartum day #3.  Normal spontaneous vaginal delivery of viable female and female.  Routine postoperative and postpartum care.  Patient was admitted on March 20, 2001 for premature prolonged rupture of membrane.  She was maintained on Unasyn 3 g q.6h.  The patient received steroids at a prior admission.  On March 23, 2001, the patient was noted to have positive PG on Amniostat.  Pitocin induction ensued. Patient progressed to complete.  She delivered twin "A" in a vertex position in the operating room.  After delivery of twin "A," twin "B" was noted to have an unstable transverse lie with backup which could not be delivered vaginally.  This required an emergent low-transverse cesarean section  and bilateral tubal ligation.  The patient remained afebrile postoperatively.  She recovered well.  She will be discharged home with routine instructions.  DISPOSITION:  Good.  MEDICATIONS: 1. Percocet 5 mg 325 mg 1-2 p.o. q.4-6h. p.r.n. pain. 2. Motrin 600 mg 1 p.o. q.6-8h. p.r.n. pain. 3. Prenatal vitamins 1 p.o. q.d. x 6 weeks.  FOLLOWUP: 1. The patient will follow up with her psychiatrist for further psychotropic    medications. 2. The patient will follow up with maternity admissions in two days for staple    removal. 3. She will follow up in six weeks at [facility] for further evaluation    and routine followup. DD:  03/27/01 TD:  03/28/01 Job: [id number]
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
## 2011-06-17 ENCOUNTER — Emergency Department (HOSPITAL_COMMUNITY)
Admission: EM | Admit: 2011-06-17 | Discharge: 2011-06-17 | Disposition: A | Discharge status: Home / Self Care | Payer: Medicare Other | Attending: Internal Medicine | Admitting: Internal Medicine

## 2011-06-17 DIAGNOSIS — H5789 Other specified disorders of eye and adnexa: Secondary | ICD-10-CM | POA: Insufficient documentation

## 2011-06-17 DIAGNOSIS — H00039 Abscess of eyelid unspecified eye, unspecified eyelid: Secondary | ICD-10-CM | POA: Insufficient documentation

## 2011-06-17 DIAGNOSIS — Z8614 Personal history of Methicillin resistant Staphylococcus aureus infection: Secondary | ICD-10-CM | POA: Insufficient documentation

## 2011-07-13 ENCOUNTER — Inpatient Hospital Stay (INDEPENDENT_AMBULATORY_CARE_PROVIDER_SITE_OTHER)
Admission: RE | Admit: 2011-07-13 | Discharge: 2011-07-13 | Disposition: A | Discharge status: Home / Self Care | Payer: Medicare Other | Source: Ambulatory Visit | Attending: Emergency Medicine | Admitting: Emergency Medicine

## 2011-07-13 DIAGNOSIS — T6391XA Toxic effect of contact with unspecified venomous animal, accidental (unintentional), initial encounter: Secondary | ICD-10-CM

## 2011-07-13 DIAGNOSIS — H05019 Cellulitis of unspecified orbit: Secondary | ICD-10-CM

## 2012-02-07 DIAGNOSIS — J309 Allergic rhinitis, unspecified: Secondary | ICD-10-CM | POA: Diagnosis not present

## 2012-04-20 DIAGNOSIS — J069 Acute upper respiratory infection, unspecified: Secondary | ICD-10-CM | POA: Diagnosis not present

## 2012-06-16 DIAGNOSIS — N63 Unspecified lump in unspecified breast: Secondary | ICD-10-CM | POA: Diagnosis not present

## 2012-06-17 ENCOUNTER — Other Ambulatory Visit: Payer: Self-pay | Admitting: Radiology

## 2012-06-17 DIAGNOSIS — N6039 Fibrosclerosis of unspecified breast: Secondary | ICD-10-CM | POA: Diagnosis not present

## 2012-06-17 DIAGNOSIS — Z0189 Encounter for other specified special examinations: Secondary | ICD-10-CM | POA: Diagnosis not present

## 2012-06-17 DIAGNOSIS — N63 Unspecified lump in unspecified breast: Secondary | ICD-10-CM | POA: Diagnosis not present

## 2012-07-09 ENCOUNTER — Emergency Department (HOSPITAL_COMMUNITY): Payer: Medicare Other

## 2012-07-09 ENCOUNTER — Emergency Department (HOSPITAL_COMMUNITY)
Admission: EM | Admit: 2012-07-09 | Discharge: 2012-07-09 | Disposition: A | Discharge status: Home / Self Care | Payer: Medicare Other | Attending: Emergency Medicine | Admitting: Emergency Medicine

## 2012-07-09 ENCOUNTER — Encounter (HOSPITAL_COMMUNITY): Payer: Self-pay | Admitting: Emergency Medicine

## 2012-07-09 DIAGNOSIS — S91039A Puncture wound without foreign body, unspecified ankle, initial encounter: Secondary | ICD-10-CM

## 2012-07-09 DIAGNOSIS — S81009A Unspecified open wound, unspecified knee, initial encounter: Secondary | ICD-10-CM | POA: Diagnosis not present

## 2012-07-09 DIAGNOSIS — S91309A Unspecified open wound, unspecified foot, initial encounter: Secondary | ICD-10-CM | POA: Diagnosis not present

## 2012-07-09 DIAGNOSIS — Y9301 Activity, walking, marching and hiking: Secondary | ICD-10-CM | POA: Insufficient documentation

## 2012-07-09 DIAGNOSIS — W268XXA Contact with other sharp object(s), not elsewhere classified, initial encounter: Secondary | ICD-10-CM | POA: Insufficient documentation

## 2012-07-09 DIAGNOSIS — S91009A Unspecified open wound, unspecified ankle, initial encounter: Secondary | ICD-10-CM | POA: Diagnosis not present

## 2012-07-09 DIAGNOSIS — S81809A Unspecified open wound, unspecified lower leg, initial encounter: Secondary | ICD-10-CM | POA: Diagnosis not present

## 2012-07-09 DIAGNOSIS — M7989 Other specified soft tissue disorders: Secondary | ICD-10-CM | POA: Diagnosis not present

## 2012-07-09 DIAGNOSIS — Y998 Other external cause status: Secondary | ICD-10-CM | POA: Insufficient documentation

## 2012-07-09 DIAGNOSIS — F17200 Nicotine dependence, unspecified, uncomplicated: Secondary | ICD-10-CM | POA: Insufficient documentation

## 2012-07-09 MED ORDER — TETANUS-DIPHTH-ACELL PERTUSSIS 5-2.5-18.5 LF-MCG/0.5 IM SUSP
0.5000 mL | Freq: Once | INTRAMUSCULAR | Status: AC
  Administered 2012-07-09: 0.5 mL via INTRAMUSCULAR
  Filled 2012-07-09: qty 0.5

## 2012-07-09 MED ORDER — OXYCODONE-ACETAMINOPHEN 5-325 MG PO TABS
1.0000 | ORAL_TABLET | Freq: Once | ORAL | Status: AC
  Administered 2012-07-09: 1 via ORAL
  Filled 2012-07-09: qty 1

## 2012-07-09 MED ORDER — OXYCODONE-ACETAMINOPHEN 5-325 MG PO TABS: 1.0000 | ORAL_TABLET | ORAL | Status: AC | PRN

## 2012-07-09 MED ORDER — AMOXICILLIN-POT CLAVULANATE 875-125 MG PO TABS: 1.0000 | ORAL_TABLET | Freq: Two times a day (BID) | ORAL | Status: AC

## 2012-07-09 NOTE — ED Notes (Signed)
Emily, PA at bedside.

## 2012-07-09 NOTE — ED Provider Notes (Signed)
History     CSN: [id number]  Arrival date & time 07/09/12  1516   First MD Initiated Contact with Patient 07/09/12 1646      Chief Complaint  Patient presents with  . Puncture Wound    puncture wound due to thin metal rod forced into outer aspect of l/ankle. Metal rod approx 6 inches.Pt removed it herself    (Consider location/radiation/quality/duration/timing/severity/associated sxs/prior treatment) HPI Comments: Patient reports she was accidentally punctured through the medial aspect of her right foot/ankle while walking, states she felt that that the piece of metal almost exited through the opposite ankle.  States she pulled the metal out herself.  Reports pain in her ankle.  Pt states she was able to walk into the ED without difficulty but as the swelling and pain have increased, she is having more trouble bending her foot.  Pain is throbbing and burning.  No radiation.  Worse with palpation and movement.  Denies other injury.    The history is provided by the patient.    History reviewed. No pertinent past medical history.  Past Surgical History  Procedure Date  . Cesarean section   . Tubal ligation     Family History  Problem Relation Age of Onset  . Diabetes Mother   . Hypertension Mother   . Stroke Brother     History  Substance Use Topics  . Smoking status: Current Everyday Smoker    Types: Cigarettes  . Smokeless tobacco: Not on file  . Alcohol Use: No    OB History    Grav Para Term Preterm Abortions TAB SAB Ect Mult Living                  Review of Systems  Skin: Positive for wound. Negative for color change, pallor and rash.  Neurological: Negative for weakness and numbness.    Allergies  Bactrim  Home Medications   Current Outpatient Rx  Name Route Sig Dispense Refill  . IBUPROFEN 200 MG PO TABS Oral Take 800 mg by mouth every 6 (six) hours as needed.      BP 128/83  Pulse 75  Temp 97.6 F (36.4 C)  Resp 18  SpO2 98%  LMP  07/05/2012  Physical Exam  Nursing note and vitals reviewed. Constitutional: She appears well-developed and well-nourished. No distress.  HENT:  Head: Normocephalic and atraumatic.  Neck: Neck supple.  Pulmonary/Chest: Effort normal.  Musculoskeletal:       Left ankle: She exhibits decreased range of motion and swelling. She exhibits no ecchymosis, no deformity and normal pulse.       Left foot: She exhibits tenderness and swelling. She exhibits normal range of motion, normal capillary refill, no crepitus and no deformity.       Feet:       Diffuse tenderness across anterior ankle.  Pt is able to plantar flex and dorsiflex her ankle, though with pain.  Moves all toes, sensation intact throughout, capillary refill < 2 seconds.    Neurological: She is alert.  Skin: She is not diaphoretic.    ED Course  Procedures (including critical care time)  Labs Reviewed - No data to display Dg Ankle Complete Left  07/09/2012  *RADIOLOGY REPORT*  Clinical Data: Puncture wound.  LEFT ANKLE COMPLETE - 3+ VIEW  Comparison: None  Findings: The ankle mortise is maintained.  No acute fracture or osteochondral abnormality.  There is moderate lateral soft tissue swelling noted.  No definite joint effusion.  No radiopaque  foreign body.  IMPRESSION: No fracture or radiopaque foreign body.  Original Report Authenticated By: P. Loralie Champagne, M.D.   Dg Foot Complete Left  07/09/2012  *RADIOLOGY REPORT*  Clinical Data: Puncture wound  LEFT FOOT - COMPLETE 3+ VIEW  Comparison: None.  Findings: Normal alignment without fracture.  Preserved joint spaces.  No radiopaque foreign body.  Mild soft tissue swelling medially.  IMPRESSION: No acute osseous finding or radiopaque foreign body  Original Report Authenticated By: Judie Petit. Ruel Favors, M.D.   Discussed patient with Dr Oletta Lamas.   1. Puncture wound of ankle       MDM  Pt with deep puncture wound with metal rod through left ankle.  Entry and exit wound is the same - pt  removed the metal herself.  Xrays are negative, no e/o FB or fracture.  Wound cleaned and dressed in ED.  Pt d/c home with Augmentin to prevent infection, percocet for pain.  Tetanus updated. Discussed all results with patient.  Pt given return precautions.  Pt verbalizes understanding and agrees with plan.          [place], [place] 07/09/12 [phone]

## 2012-07-09 NOTE — ED Notes (Signed)
Pt was working in the yard, long metal rod punctured ankle. Swelling noted on opposite side

## 2012-07-10 NOTE — ED Provider Notes (Signed)
Medical screening examination/treatment/procedure(s) were performed by non-physician practitioner and as supervising physician I was immediately available for consultation/collaboration.   Donnika Kucher Y. Lucinda Spells, MD 07/10/12 1307 

## 2013-09-23 ENCOUNTER — Other Ambulatory Visit: Payer: Self-pay | Admitting: Nurse Practitioner

## 2013-09-23 ENCOUNTER — Other Ambulatory Visit (HOSPITAL_COMMUNITY)
Admission: RE | Admit: 2013-09-23 | Discharge: 2013-09-23 | Disposition: A | Discharge status: Home / Self Care | Payer: Medicare Other | Source: Ambulatory Visit | Attending: Nurse Practitioner | Admitting: Nurse Practitioner

## 2013-09-23 DIAGNOSIS — Z1151 Encounter for screening for human papillomavirus (HPV): Secondary | ICD-10-CM | POA: Insufficient documentation

## 2013-09-23 DIAGNOSIS — N760 Acute vaginitis: Secondary | ICD-10-CM | POA: Diagnosis not present

## 2013-09-23 DIAGNOSIS — Z113 Encounter for screening for infections with a predominantly sexual mode of transmission: Secondary | ICD-10-CM | POA: Diagnosis not present

## 2013-09-23 DIAGNOSIS — Z124 Encounter for screening for malignant neoplasm of cervix: Secondary | ICD-10-CM | POA: Diagnosis not present

## 2014-03-21 ENCOUNTER — Emergency Department (HOSPITAL_COMMUNITY)
Admission: EM | Admit: 2014-03-21 | Discharge: 2014-03-21 | Discharge status: Against Medical Advice | Payer: Medicare Other | Attending: Emergency Medicine | Admitting: Emergency Medicine

## 2014-03-21 ENCOUNTER — Encounter (HOSPITAL_COMMUNITY): Payer: Self-pay | Admitting: Emergency Medicine

## 2014-03-21 DIAGNOSIS — H5710 Ocular pain, unspecified eye: Secondary | ICD-10-CM | POA: Insufficient documentation

## 2014-03-21 DIAGNOSIS — H5789 Other specified disorders of eye and adnexa: Secondary | ICD-10-CM | POA: Insufficient documentation

## 2014-03-21 DIAGNOSIS — F1310 Sedative, hypnotic or anxiolytic abuse, uncomplicated: Secondary | ICD-10-CM | POA: Insufficient documentation

## 2014-03-21 DIAGNOSIS — F17200 Nicotine dependence, unspecified, uncomplicated: Secondary | ICD-10-CM | POA: Insufficient documentation

## 2014-03-21 NOTE — ED Notes (Signed)
Patient c/o left eye redness, watering, and pain that started this AM.

## 2014-03-21 NOTE — ED Notes (Signed)
Charge called for pt, no response, charge went outside and called for pt. Another pt reports pt had left.

## 2014-08-01 ENCOUNTER — Emergency Department (HOSPITAL_COMMUNITY): Payer: No Typology Code available for payment source

## 2014-08-01 ENCOUNTER — Emergency Department (HOSPITAL_COMMUNITY)
Admission: EM | Admit: 2014-08-01 | Discharge: 2014-08-01 | Disposition: A | Discharge status: Home / Self Care | Payer: No Typology Code available for payment source | Attending: Emergency Medicine | Admitting: Emergency Medicine

## 2014-08-01 ENCOUNTER — Encounter (HOSPITAL_COMMUNITY): Payer: Self-pay | Admitting: Emergency Medicine

## 2014-08-01 DIAGNOSIS — IMO0002 Reserved for concepts with insufficient information to code with codable children: Secondary | ICD-10-CM | POA: Insufficient documentation

## 2014-08-01 DIAGNOSIS — Z79899 Other long term (current) drug therapy: Secondary | ICD-10-CM | POA: Diagnosis not present

## 2014-08-01 DIAGNOSIS — F17200 Nicotine dependence, unspecified, uncomplicated: Secondary | ICD-10-CM | POA: Diagnosis not present

## 2014-08-01 DIAGNOSIS — R55 Syncope and collapse: Secondary | ICD-10-CM | POA: Insufficient documentation

## 2014-08-01 DIAGNOSIS — Y9389 Activity, other specified: Secondary | ICD-10-CM | POA: Diagnosis not present

## 2014-08-01 DIAGNOSIS — R42 Dizziness and giddiness: Secondary | ICD-10-CM | POA: Insufficient documentation

## 2014-08-01 DIAGNOSIS — Z8739 Personal history of other diseases of the musculoskeletal system and connective tissue: Secondary | ICD-10-CM | POA: Insufficient documentation

## 2014-08-01 DIAGNOSIS — M545 Low back pain, unspecified: Secondary | ICD-10-CM

## 2014-08-01 DIAGNOSIS — Y92410 Unspecified street and highway as the place of occurrence of the external cause: Secondary | ICD-10-CM | POA: Diagnosis not present

## 2014-08-01 DIAGNOSIS — R112 Nausea with vomiting, unspecified: Secondary | ICD-10-CM | POA: Insufficient documentation

## 2014-08-01 HISTORY — DX: Reserved for concepts with insufficient information to code with codable children: IMO0002

## 2014-08-01 MED ORDER — HYDROCODONE-ACETAMINOPHEN 5-325 MG PO TABS: 1.0000 | ORAL_TABLET | Freq: Four times a day (QID) | ORAL | Status: DC | PRN

## 2014-08-01 MED ORDER — CYCLOBENZAPRINE HCL 10 MG PO TABS: 10.0000 mg | ORAL_TABLET | Freq: Two times a day (BID) | ORAL | Status: DC | PRN

## 2014-08-01 MED ORDER — HYDROCODONE-ACETAMINOPHEN 5-325 MG PO TABS
2.0000 | ORAL_TABLET | Freq: Once | ORAL | Status: AC
  Administered 2014-08-01: 2 via ORAL
  Filled 2014-08-01: qty 2

## 2014-08-01 MED ORDER — DIAZEPAM 5 MG PO TABS
5.0000 mg | ORAL_TABLET | Freq: Once | ORAL | Status: AC
  Administered 2014-08-01: 5 mg via ORAL
  Filled 2014-08-01: qty 1
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
## 2014-08-02 NOTE — ED Provider Notes (Signed)
Medical screening examination/treatment/procedure(s) were performed by non-physician practitioner and as supervising physician I was immediately available for consultation/collaboration.   EKG Interpretation None        Matthew Gentry, MD 08/02/14 0104 

## 2014-08-04 DIAGNOSIS — R51 Headache: Secondary | ICD-10-CM | POA: Diagnosis not present

## 2014-08-04 DIAGNOSIS — H60399 Other infective otitis externa, unspecified ear: Secondary | ICD-10-CM | POA: Diagnosis not present

## 2014-09-20 DIAGNOSIS — H109 Unspecified conjunctivitis: Secondary | ICD-10-CM | POA: Diagnosis not present

## 2014-10-12 DIAGNOSIS — H1033 Unspecified acute conjunctivitis, bilateral: Secondary | ICD-10-CM | POA: Diagnosis not present

## 2014-11-25 DIAGNOSIS — F319 Bipolar disorder, unspecified: Secondary | ICD-10-CM | POA: Diagnosis not present

## 2015-01-02 DIAGNOSIS — F419 Anxiety disorder, unspecified: Secondary | ICD-10-CM | POA: Diagnosis not present

## 2015-01-12 DIAGNOSIS — L309 Dermatitis, unspecified: Secondary | ICD-10-CM | POA: Diagnosis not present

## 2015-01-14 ENCOUNTER — Emergency Department (HOSPITAL_COMMUNITY)
Admission: EM | Admit: 2015-01-14 | Discharge: 2015-01-14 | Disposition: A | Discharge status: Home / Self Care | Payer: Medicare Other | Attending: Emergency Medicine | Admitting: Emergency Medicine

## 2015-01-14 ENCOUNTER — Encounter (HOSPITAL_COMMUNITY): Payer: Self-pay | Admitting: Emergency Medicine

## 2015-01-14 DIAGNOSIS — Y9289 Other specified places as the place of occurrence of the external cause: Secondary | ICD-10-CM | POA: Insufficient documentation

## 2015-01-14 DIAGNOSIS — Z79899 Other long term (current) drug therapy: Secondary | ICD-10-CM | POA: Insufficient documentation

## 2015-01-14 DIAGNOSIS — X58XXXA Exposure to other specified factors, initial encounter: Secondary | ICD-10-CM | POA: Insufficient documentation

## 2015-01-14 DIAGNOSIS — Y998 Other external cause status: Secondary | ICD-10-CM | POA: Insufficient documentation

## 2015-01-14 DIAGNOSIS — Y9389 Activity, other specified: Secondary | ICD-10-CM | POA: Diagnosis not present

## 2015-01-14 DIAGNOSIS — Z720 Tobacco use: Secondary | ICD-10-CM | POA: Insufficient documentation

## 2015-01-14 DIAGNOSIS — S0591XA Unspecified injury of right eye and orbit, initial encounter: Secondary | ICD-10-CM | POA: Diagnosis present

## 2015-01-14 DIAGNOSIS — S0511XA Contusion of eyeball and orbital tissues, right eye, initial encounter: Secondary | ICD-10-CM | POA: Diagnosis not present

## 2015-01-14 MED ORDER — FLUORESCEIN SODIUM 1 MG OP STRP
1.0000 | ORAL_STRIP | Freq: Once | OPHTHALMIC | Status: AC
  Administered 2015-01-14: 1 via OPHTHALMIC
  Filled 2015-01-14: qty 1

## 2015-01-14 MED ORDER — ONDANSETRON 4 MG PO TBDP
4.0000 mg | ORAL_TABLET | Freq: Once | ORAL | Status: AC
  Administered 2015-01-14: 4 mg via ORAL
  Filled 2015-01-14: qty 1

## 2015-01-14 MED ORDER — TETRACAINE HCL 0.5 % OP SOLN
2.0000 [drp] | Freq: Once | OPHTHALMIC | Status: AC
  Administered 2015-01-14: 2 [drp] via OPHTHALMIC
  Filled 2015-01-14: qty 2

## 2015-01-14 MED ORDER — ERYTHROMYCIN 5 MG/GM OP OINT: TOPICAL_OINTMENT | OPHTHALMIC | Status: DC

## 2015-01-14 NOTE — Discharge Instructions (Signed)
Eye Contusion °An eye contusion is a deep bruise of the eye. This is often called a "black eye." Contusions are the result of an injury that caused bleeding under the skin. The contusion may turn blue, purple, or yellow. Minor injuries will give you a painless contusion, but more severe contusions may stay painful and swollen for a few weeks. If the eye contusion only involves the eyelids and tissues around the eye, the injured area will get better within a few days to weeks. However, eye contusions can be serious and affect the eyeball and sight. °CAUSES  °· Blunt injury or trauma to the face or eye area. °· A forehead injury that causes the blood under the skin to work its way down to the eyelids. °· Rubbing the eyes due to irritation. °SYMPTOMS  °· Swelling and redness around the eye. °· Bruising around the eye. °· Tenderness, soreness, or pain around the eye. °· Blurry vision. °· Tearing. °· Eyeball redness. °DIAGNOSIS  °A diagnosis is usually based on a thorough exam of the eye and surrounding area. The eye must be looked at carefully to make sure it is not injured and to make sure nothing else will threaten your vision. A vision test may be done. An X-ray or computed tomography (CT) scan may be needed to determine if there are any associated injuries, such as broken bones (fractures). °TREATMENT  °If there is an injury to the eye, treatment will be determined by the nature of the injury. °HOME CARE INSTRUCTIONS  °· Put ice on the injured area. °¨ Put ice in a plastic bag. °¨ Place a towel between your skin and the bag. °¨ Leave the ice on for 15-20 minutes, 03-04 times a day. °· If it is determined that there is no injury to the eye, you may continue normal activities. °· Sunglasses may be worn to protect your eyes from bright light if light is uncomfortable. °· Sleep with your head elevated. You can put an extra pillow under your head. This may help with discomfort. °· Only take over-the-counter or  prescription medicines for pain, discomfort, or fever as directed by your caregiver. Do not take aspirin for the first few days. This may increase bruising. °SEEK IMMEDIATE MEDICAL CARE IF:  °· You have any form of vision loss. °· You have double vision. °· You feel nauseous. °· You feel dizzy, sleepy, or like you will faint. °· You have any fluid discharge from the eye or your nose. °· You have swelling and discoloration that does not fade. °MAKE SURE YOU:  °· Understand these instructions. °· Will watch your condition. °· Will get help right away if you are not doing well or get worse. °Document Released: 11/22/2000 Document Revised: 02/17/2012 Document Reviewed: 10/11/2011 °Exit[employer]® Patient Information ©2015 [employer]. This information is not intended to replace advice given to you by your health care provider. Make sure you discuss any questions you have with your health care provider. ° °

## 2015-01-14 NOTE — ED Notes (Signed)
Pt states that she has had eye irritation and was prescribed drops.  States that her dog "stuck his paw in her eye" yesterday and states that it has gotten worse.  States that they have chickens and that "her dog eats trash" so she is worried about "what is in her eye."  Also states that she is "extremely nauseated.

## 2015-01-14 NOTE — ED Notes (Signed)
Pt alert, oriented, and ambulatory upon DC. She was advised to follow up with eye doctor in one 1 week if not better.

## 2015-01-14 NOTE — ED Provider Notes (Signed)
CSN: [id number]     Arrival date & time 01/14/15  1314 History   First MD Initiated Contact with Patient 01/14/15 1333     Chief Complaint  Patient presents with  . Eye Problem  . Nausea     (Consider location/radiation/quality/duration/timing/severity/associated sxs/prior Treatment) Patient is a 39 y.o. female presenting with eye injury. The history is provided by the patient.  Eye Injury This is a new problem. The current episode started yesterday. Episode frequency: once. The problem has been gradually improving. Pertinent negatives include no chest pain, no abdominal pain, no headaches and no shortness of breath. Nothing aggravates the symptoms. Nothing relieves the symptoms. She has tried nothing for the symptoms. The treatment provided no relief.    Past Medical History  Diagnosis Date  . Degenerative disc disease    Past Surgical History  Procedure Laterality Date  . Cesarean section    . Tubal ligation     Family History  Problem Relation Age of Onset  . Diabetes Mother   . Hypertension Mother   . Stroke Brother    History  Substance Use Topics  . Smoking status: Current Every Day Smoker -- 0.50 packs/day    Types: Cigarettes  . Smokeless tobacco: Never Used  . Alcohol Use: No   OB History    No data available     Review of Systems  Constitutional: Negative for fever and fatigue.  HENT: Negative for congestion and drooling.   Eyes: Negative for pain.  Respiratory: Negative for cough and shortness of breath.   Cardiovascular: Negative for chest pain.  Gastrointestinal: Negative for nausea, vomiting, abdominal pain and diarrhea.  Genitourinary: Negative for dysuria and hematuria.  Musculoskeletal: Negative for back pain, gait problem and neck pain.  Skin: Negative for color change.  Neurological: Negative for dizziness and headaches.  Hematological: Negative for adenopathy.  Psychiatric/Behavioral: Negative for behavioral problems.  All other systems  reviewed and are negative.     Allergies  Bactrim  Home Medications   Prior to Admission medications   Medication Sig Start Date End Date Taking? Authorizing Provider  cyclobenzaprine (FLEXERIL) 10 MG tablet Take 1 tablet (10 mg total) by mouth 2 (two) times daily as needed for muscle spasms. 08/01/14   Roxy Horsemanobert Browning, PA-C  HYDROcodone-acetaminophen (NORCO/VICODIN) 5-325 MG per tablet Take 1-2 tablets by mouth every 6 (six) hours as needed for moderate pain or severe pain. 08/01/14   Roxy Horsemanobert Browning, PA-C  ibuprofen (ADVIL,MOTRIN) 200 MG tablet Take 800 mg by mouth every 6 (six) hours as needed for moderate pain.     Historical Provider, MD  Multiple Vitamin (MULTIVITAMIN WITH MINERALS) TABS tablet Take 1 tablet by mouth daily.    Historical Provider, MD   BP 131/96 mmHg  Pulse 77  Temp(Src) 97.7 F (36.5 C) (Oral)  Resp 19  SpO2 100% Physical Exam  Constitutional: She is oriented to person, place, and time. She appears well-developed and well-nourished.  HENT:  Head: Normocephalic.  Mouth/Throat: Oropharynx is clear and moist. No oropharyngeal exudate.  Eyes: EOM are normal. Pupils are equal, round, and reactive to light.  Mild conjunctival injection noted in the right eye. Mild infraorbital swelling and redness is noted.  Neck: Normal range of motion. Neck supple.  Cardiovascular: Normal rate, regular rhythm, normal heart sounds and intact distal pulses.  Exam reveals no gallop and no friction rub.   No murmur heard. Pulmonary/Chest: Effort normal and breath sounds normal. No respiratory distress. She has no wheezes.  Abdominal: Soft.  Bowel sounds are normal. There is no tenderness. There is no rebound and no guarding.  Musculoskeletal: Normal range of motion. She exhibits no edema or tenderness.  Neurological: She is alert and oriented to person, place, and time.  Skin: Skin is warm and dry.  Psychiatric: She has a normal mood and affect. Her behavior is normal.  Nursing  note and vitals reviewed.   ED Course  Procedures (including critical care time) Labs Review Labs Reviewed - No data to display  Imaging Review No results found.   EKG Interpretation None      MDM   Final diagnoses:  Contusion of right eye, initial encounter    1:54 PM 39 y.o. female presents with right eye pain after her dog scratched her in the eye with his polyp yesterday. She notes swelling and some matting this morning. She has mild eye pain currently and the swelling has gone down. She has 20/20 vision on my exam. She is afebrile and vital signs are unremarkable here. We'll get tetracaine and stain the eye.  No evidence of abrasion upon staining the eye. Will ever with erythromycin eye ointment and recommended she follow-up with her optometrist as needed.  2:59 PM:  I have discussed the diagnosis/risks/treatment options with the patient and believe the pt to be eligible for discharge home to follow-up with optometrist as needed. We also discussed returning to the ED immediately if new or worsening sx occur. We discussed the sx which are most concerning (e.g., worsening pain, vision changes) that necessitate immediate return. Medications administered to the patient during their visit and any new prescriptions provided to the patient are listed below.  Medications given during this visit Medications  fluorescein ophthalmic strip 1 strip (not administered)  tetracaine (PONTOCAINE) 0.5 % ophthalmic solution 2 drop (not administered)  ondansetron (ZOFRAN-ODT) disintegrating tablet 4 mg (4 mg Oral Given 01/14/15 1424)    New Prescriptions   No medications on file     Purvis Sheffield, MD 01/14/15 1531

## 2015-02-22 DIAGNOSIS — F319 Bipolar disorder, unspecified: Secondary | ICD-10-CM | POA: Diagnosis not present

## 2015-02-28 ENCOUNTER — Ambulatory Visit
Admission: RE | Admit: 2015-02-28 | Discharge: 2015-02-28 | Disposition: A | Discharge status: Home / Self Care | Payer: Medicare Other | Source: Ambulatory Visit | Attending: Family Medicine | Admitting: Family Medicine

## 2015-02-28 ENCOUNTER — Other Ambulatory Visit: Payer: Self-pay | Admitting: Family Medicine

## 2015-02-28 DIAGNOSIS — M25571 Pain in right ankle and joints of right foot: Secondary | ICD-10-CM | POA: Diagnosis not present

## 2015-02-28 DIAGNOSIS — W19XXXA Unspecified fall, initial encounter: Secondary | ICD-10-CM | POA: Diagnosis not present

## 2015-02-28 DIAGNOSIS — M79605 Pain in left leg: Secondary | ICD-10-CM

## 2015-02-28 DIAGNOSIS — M549 Dorsalgia, unspecified: Secondary | ICD-10-CM | POA: Diagnosis not present

## 2015-02-28 DIAGNOSIS — M79662 Pain in left lower leg: Secondary | ICD-10-CM | POA: Diagnosis not present

## 2015-02-28 DIAGNOSIS — S8992XA Unspecified injury of left lower leg, initial encounter: Secondary | ICD-10-CM | POA: Diagnosis not present

## 2015-02-28 DIAGNOSIS — M79606 Pain in leg, unspecified: Secondary | ICD-10-CM | POA: Diagnosis not present

## 2015-02-28 DIAGNOSIS — M7989 Other specified soft tissue disorders: Secondary | ICD-10-CM | POA: Diagnosis not present

## 2015-02-28 DIAGNOSIS — L0889 Other specified local infections of the skin and subcutaneous tissue: Secondary | ICD-10-CM | POA: Diagnosis not present

## 2015-02-28 DIAGNOSIS — S80819A Abrasion, unspecified lower leg, initial encounter: Secondary | ICD-10-CM | POA: Diagnosis not present

## 2015-03-09 DIAGNOSIS — M5136 Other intervertebral disc degeneration, lumbar region: Secondary | ICD-10-CM | POA: Diagnosis not present

## 2015-03-09 DIAGNOSIS — M19031 Primary osteoarthritis, right wrist: Secondary | ICD-10-CM | POA: Diagnosis not present

## 2015-03-09 DIAGNOSIS — M19032 Primary osteoarthritis, left wrist: Secondary | ICD-10-CM | POA: Diagnosis not present

## 2015-03-23 DIAGNOSIS — M25372 Other instability, left ankle: Secondary | ICD-10-CM | POA: Diagnosis not present

## 2015-03-23 DIAGNOSIS — M25371 Other instability, right ankle: Secondary | ICD-10-CM | POA: Diagnosis not present

## 2015-05-17 ENCOUNTER — Emergency Department (HOSPITAL_COMMUNITY)
Admission: EM | Admit: 2015-05-17 | Discharge: 2015-05-18 | Discharge status: Against Medical Advice | Payer: Medicare Other | Attending: Emergency Medicine | Admitting: Emergency Medicine

## 2015-05-17 ENCOUNTER — Encounter (HOSPITAL_COMMUNITY): Payer: Self-pay | Admitting: *Deleted

## 2015-05-17 DIAGNOSIS — Z720 Tobacco use: Secondary | ICD-10-CM | POA: Diagnosis not present

## 2015-05-17 DIAGNOSIS — H578 Other specified disorders of eye and adnexa: Secondary | ICD-10-CM | POA: Diagnosis not present

## 2015-05-17 NOTE — ED Notes (Addendum)
Pt noted to be walking out of the lobby to the parking lot, yet to return.

## 2015-05-17 NOTE — ED Notes (Signed)
Pt states that she has been treated for eye infection for the last 2 months; pt states that she has been treated with several different eye gtts and antibiotics; pt states that she has not taken any meds in 6 weeks; pt states that she has been using artificial tears to clear drainage from her eyes; pt c/o irritation to the skin around eyes; pt also c/o seeing stuff floating at times to her eyes

## 2015-05-18 DIAGNOSIS — H109 Unspecified conjunctivitis: Secondary | ICD-10-CM | POA: Diagnosis not present

## 2015-06-20 DIAGNOSIS — H1013 Acute atopic conjunctivitis, bilateral: Secondary | ICD-10-CM | POA: Diagnosis not present

## 2015-06-20 DIAGNOSIS — H04123 Dry eye syndrome of bilateral lacrimal glands: Secondary | ICD-10-CM | POA: Diagnosis not present

## 2015-07-05 DIAGNOSIS — H578 Other specified disorders of eye and adnexa: Secondary | ICD-10-CM | POA: Diagnosis not present

## 2015-07-05 DIAGNOSIS — H1013 Acute atopic conjunctivitis, bilateral: Secondary | ICD-10-CM | POA: Diagnosis not present

## 2015-07-05 DIAGNOSIS — H04123 Dry eye syndrome of bilateral lacrimal glands: Secondary | ICD-10-CM | POA: Diagnosis not present

## 2015-07-12 DIAGNOSIS — H1013 Acute atopic conjunctivitis, bilateral: Secondary | ICD-10-CM | POA: Diagnosis not present

## 2015-07-12 DIAGNOSIS — H578 Other specified disorders of eye and adnexa: Secondary | ICD-10-CM | POA: Diagnosis not present

## 2015-07-12 DIAGNOSIS — H04123 Dry eye syndrome of bilateral lacrimal glands: Secondary | ICD-10-CM | POA: Diagnosis not present

## 2015-12-27 ENCOUNTER — Inpatient Hospital Stay (HOSPITAL_COMMUNITY)
Admission: EM | Admit: 2015-12-27 | Discharge: 2015-12-29 | DRG: 419 | Disposition: A | Discharge status: Home / Self Care | Payer: Medicare Other | Attending: General Surgery | Admitting: General Surgery

## 2015-12-27 ENCOUNTER — Encounter (HOSPITAL_COMMUNITY): Payer: Self-pay | Admitting: Emergency Medicine

## 2015-12-27 ENCOUNTER — Emergency Department (HOSPITAL_COMMUNITY): Payer: Medicare Other

## 2015-12-27 DIAGNOSIS — K8010 Calculus of gallbladder with chronic cholecystitis without obstruction: Secondary | ICD-10-CM | POA: Diagnosis not present

## 2015-12-27 DIAGNOSIS — F17210 Nicotine dependence, cigarettes, uncomplicated: Secondary | ICD-10-CM | POA: Diagnosis present

## 2015-12-27 DIAGNOSIS — K819 Cholecystitis, unspecified: Secondary | ICD-10-CM | POA: Diagnosis not present

## 2015-12-27 DIAGNOSIS — Z833 Family history of diabetes mellitus: Secondary | ICD-10-CM

## 2015-12-27 DIAGNOSIS — Z8249 Family history of ischemic heart disease and other diseases of the circulatory system: Secondary | ICD-10-CM | POA: Diagnosis not present

## 2015-12-27 DIAGNOSIS — Z882 Allergy status to sulfonamides status: Secondary | ICD-10-CM

## 2015-12-27 DIAGNOSIS — R1111 Vomiting without nausea: Secondary | ICD-10-CM | POA: Diagnosis not present

## 2015-12-27 DIAGNOSIS — Z823 Family history of stroke: Secondary | ICD-10-CM | POA: Diagnosis not present

## 2015-12-27 DIAGNOSIS — R112 Nausea with vomiting, unspecified: Secondary | ICD-10-CM | POA: Diagnosis not present

## 2015-12-27 DIAGNOSIS — Z79899 Other long term (current) drug therapy: Secondary | ICD-10-CM | POA: Diagnosis not present

## 2015-12-27 DIAGNOSIS — K2970 Gastritis, unspecified, without bleeding: Secondary | ICD-10-CM | POA: Diagnosis not present

## 2015-12-27 DIAGNOSIS — K8050 Calculus of bile duct without cholangitis or cholecystitis without obstruction: Secondary | ICD-10-CM | POA: Diagnosis present

## 2015-12-27 DIAGNOSIS — K8012 Calculus of gallbladder with acute and chronic cholecystitis without obstruction: Principal | ICD-10-CM | POA: Diagnosis present

## 2015-12-27 DIAGNOSIS — K8020 Calculus of gallbladder without cholecystitis without obstruction: Secondary | ICD-10-CM | POA: Diagnosis not present

## 2015-12-27 DIAGNOSIS — Z419 Encounter for procedure for purposes other than remedying health state, unspecified: Secondary | ICD-10-CM

## 2015-12-27 LAB — COMPREHENSIVE METABOLIC PANEL
ALBUMIN: 4.4 g/dL (ref 3.5–5.0)
ALK PHOS: 60 U/L (ref 38–126)
ALT: 13 U/L — AB (ref 14–54)
AST: 17 U/L (ref 15–41)
Anion gap: 10 (ref 5–15)
BUN: 15 mg/dL (ref 6–20)
CALCIUM: 9.4 mg/dL (ref 8.9–10.3)
CHLORIDE: 109 mmol/L (ref 101–111)
CO2: 22 mmol/L (ref 22–32)
CREATININE: 0.93 mg/dL (ref 0.44–1.00)
GFR calc non Af Amer: >60 mL/min (ref >60)
GLUCOSE: 146 mg/dL — AB (ref 65–99)
Potassium: 4.5 mmol/L (ref 3.5–5.1)
SODIUM: 141 mmol/L (ref 135–145)
Total Bilirubin: 0.8 mg/dL (ref 0.3–1.2)
Total Protein: 7.4 g/dL (ref 6.5–8.1)

## 2015-12-27 LAB — I-STAT BETA HCG BLOOD, ED (MC, WL, AP ONLY): I-stat hCG, quantitative: <5 m[IU]/mL (ref <5)

## 2015-12-27 LAB — URINALYSIS, ROUTINE W REFLEX MICROSCOPIC
Bilirubin Urine: NEGATIVE
GLUCOSE, UA: NEGATIVE mg/dL
Hgb urine dipstick: NEGATIVE
KETONES UR: 15 mg/dL — AB
Leukocytes, UA: NEGATIVE
Nitrite: NEGATIVE
Protein, ur: NEGATIVE mg/dL
SPECIFIC GRAVITY, URINE: 1.023 (ref 1.005–1.030)
pH: 8.5 — ABNORMAL HIGH (ref 5.0–8.0)

## 2015-12-27 LAB — CBC
HCT: 43.9 % (ref 36.0–46.0)
HEMOGLOBIN: 15 g/dL (ref 12.0–15.0)
MCH: 31.3 pg (ref 26.0–34.0)
MCHC: 34.2 g/dL (ref 30.0–36.0)
MCV: 91.5 fL (ref 78.0–100.0)
Platelets: 289 10*3/uL (ref 150–400)
RBC: 4.8 MIL/uL (ref 3.87–5.11)
RDW: 13.9 % (ref 11.5–15.5)
WBC: 8.7 10*3/uL (ref 4.0–10.5)

## 2015-12-27 LAB — LIPASE, BLOOD: LIPASE: 24 U/L (ref 11–51)

## 2015-12-27 MED ORDER — SIMETHICONE 80 MG PO CHEW
40.0000 mg | CHEWABLE_TABLET | Freq: Four times a day (QID) | ORAL | Status: DC | PRN
  Administered 2015-12-28: 40 mg via ORAL
  Filled 2015-12-27 (×2): qty 1

## 2015-12-27 MED ORDER — METOCLOPRAMIDE HCL 5 MG/ML IJ SOLN
10.0000 mg | Freq: Once | INTRAMUSCULAR | Status: AC
Start: 2015-12-27 — End: 2015-12-27
  Administered 2015-12-27: 10 mg via INTRAVENOUS
  Filled 2015-12-27: qty 2

## 2015-12-27 MED ORDER — ONDANSETRON HCL 4 MG/2ML IJ SOLN
4.0000 mg | Freq: Once | INTRAMUSCULAR | Status: AC
  Administered 2015-12-27: 4 mg via INTRAVENOUS
  Filled 2015-12-27: qty 2

## 2015-12-27 MED ORDER — ONDANSETRON 4 MG PO TBDP
4.0000 mg | ORAL_TABLET | Freq: Four times a day (QID) | ORAL | Status: DC | PRN
  Filled 2015-12-27: qty 1

## 2015-12-27 MED ORDER — ONDANSETRON 4 MG PO TBDP: 4.0000 mg | ORAL_TABLET | Freq: Four times a day (QID) | ORAL | Status: DC | PRN

## 2015-12-27 MED ORDER — CHLORHEXIDINE GLUCONATE 4 % EX LIQD
1.0000 "application " | Freq: Once | CUTANEOUS | Status: AC
  Administered 2015-12-28: 1 via TOPICAL
  Filled 2015-12-27 (×2): qty 15

## 2015-12-27 MED ORDER — MORPHINE SULFATE (PF) 2 MG/ML IV SOLN
1.0000 mg | INTRAVENOUS | Status: DC | PRN
  Administered 2015-12-27: 1 mg via INTRAVENOUS
  Administered 2015-12-28: 4 mg via INTRAVENOUS
  Filled 2015-12-27: qty 1
  Filled 2015-12-27: qty 2

## 2015-12-27 MED ORDER — LORAZEPAM 2 MG/ML IJ SOLN
1.0000 mg | Freq: Once | INTRAMUSCULAR | Status: DC
  Filled 2015-12-27: qty 1

## 2015-12-27 MED ORDER — ENOXAPARIN SODIUM 40 MG/0.4ML ~~LOC~~ SOLN
40.0000 mg | Freq: Once | SUBCUTANEOUS | Status: AC
  Administered 2015-12-27: 40 mg via SUBCUTANEOUS
  Filled 2015-12-27: qty 0.4

## 2015-12-27 MED ORDER — POTASSIUM CHLORIDE IN NACL 20-0.9 MEQ/L-% IV SOLN
INTRAVENOUS | Status: DC
  Administered 2015-12-27 – 2015-12-29 (×3): 125 mL/h via INTRAVENOUS
  Filled 2015-12-27 (×6): qty 1000

## 2015-12-27 MED ORDER — HYDRALAZINE HCL 20 MG/ML IJ SOLN: 10.0000 mg | INTRAMUSCULAR | Status: DC | PRN

## 2015-12-27 MED ORDER — ACETAMINOPHEN 650 MG RE SUPP: 650.0000 mg | Freq: Four times a day (QID) | RECTAL | Status: DC | PRN

## 2015-12-27 MED ORDER — PROMETHAZINE HCL 25 MG/ML IJ SOLN
12.5000 mg | Freq: Four times a day (QID) | INTRAMUSCULAR | Status: DC | PRN
  Administered 2015-12-28 – 2015-12-29 (×2): 12.5 mg via INTRAVENOUS
  Filled 2015-12-27 (×2): qty 1

## 2015-12-27 MED ORDER — DEXTROSE 5 % IV SOLN
2.0000 g | INTRAVENOUS | Status: DC
  Administered 2015-12-27: 2 g via INTRAVENOUS
  Filled 2015-12-27: qty 2

## 2015-12-27 MED ORDER — MORPHINE SULFATE (PF) 4 MG/ML IV SOLN
4.0000 mg | Freq: Once | INTRAVENOUS | Status: DC
  Filled 2015-12-27: qty 1

## 2015-12-27 MED ORDER — HYDROMORPHONE HCL 1 MG/ML IJ SOLN
1.0000 mg | INTRAMUSCULAR | Status: DC | PRN
  Administered 2015-12-28 – 2015-12-29 (×3): 1 mg via INTRAVENOUS
  Filled 2015-12-27 (×3): qty 1

## 2015-12-27 MED ORDER — DIPHENHYDRAMINE HCL 25 MG PO CAPS: 25.0000 mg | ORAL_CAPSULE | Freq: Four times a day (QID) | ORAL | Status: DC | PRN

## 2015-12-27 MED ORDER — DEXTROSE 5 % IV SOLN: 2.0000 g | INTRAVENOUS | Status: DC

## 2015-12-27 MED ORDER — DIPHENHYDRAMINE HCL 50 MG/ML IJ SOLN: 25.0000 mg | Freq: Four times a day (QID) | INTRAMUSCULAR | Status: DC | PRN

## 2015-12-27 MED ORDER — SODIUM CHLORIDE 0.9 % IV SOLN
INTRAVENOUS | Status: DC
  Administered 2015-12-27: 12:00:00 via INTRAVENOUS

## 2015-12-27 MED ORDER — PANTOPRAZOLE SODIUM 40 MG IV SOLR
40.0000 mg | Freq: Every day | INTRAVENOUS | Status: DC
  Administered 2015-12-27 – 2015-12-28 (×2): 40 mg via INTRAVENOUS
  Filled 2015-12-27 (×4): qty 40

## 2015-12-27 MED ORDER — ONDANSETRON HCL 4 MG/2ML IJ SOLN: 4.0000 mg | Freq: Four times a day (QID) | INTRAMUSCULAR | Status: DC | PRN

## 2015-12-27 MED ORDER — SODIUM CHLORIDE 0.9 % IV SOLN
INTRAVENOUS | Status: DC
  Administered 2015-12-28: 18:00:00 via INTRAVENOUS

## 2015-12-27 MED ORDER — ACETAMINOPHEN 325 MG PO TABS: 650.0000 mg | ORAL_TABLET | Freq: Four times a day (QID) | ORAL | Status: DC | PRN

## 2015-12-27 MED ORDER — ONDANSETRON HCL 4 MG/2ML IJ SOLN
4.0000 mg | Freq: Four times a day (QID) | INTRAMUSCULAR | Status: DC | PRN
  Administered 2015-12-27 – 2015-12-29 (×3): 4 mg via INTRAVENOUS
  Filled 2015-12-27 (×3): qty 2

## 2015-12-27 MED ORDER — DIPHENHYDRAMINE HCL 50 MG/ML IJ SOLN
12.5000 mg | Freq: Once | INTRAMUSCULAR | Status: AC
  Administered 2015-12-27: 12.5 mg via INTRAVENOUS
  Filled 2015-12-27: qty 1

## 2015-12-27 MED ORDER — SODIUM CHLORIDE 0.9 % IV BOLUS (SEPSIS)
1000.0000 mL | Freq: Once | INTRAVENOUS | Status: AC
  Administered 2015-12-27: 1000 mL via INTRAVENOUS

## 2015-12-27 NOTE — H&P (Signed)
[name] 12/04/1976  [id number].   Chief Complaint/Reason for Consult: gallstones HPI: This is a 40 yo female who began having significant nausea last night around 11:00pm and then began vomiting.  She states she isn't having much pain.  She has never had anything like this before.  Nothing makes it better or worse.  She denies fevers or diarrhea.  She presented to the [facility] where she had an [place] that revealed a large 2.5cm gallstone in the neck of the gallbladder.  We have been asked to see her for further evaluation.  ROS : Please see HPI, otherwise all other systems have been reviewed and are negative  Family History  Problem Relation Age of Onset  . Diabetes Mother   . Hypertension Mother   . Stroke Brother     Past Medical History  Diagnosis Date  . Degenerative disc disease     Past Surgical History  Procedure Laterality Date  . Cesarean section    . Tubal ligation      Social History:  reports that she has been smoking Cigarettes.  She has been smoking about 0.50 packs per day. She has never used smokeless tobacco. She reports that she does not drink alcohol or use illicit drugs.  Allergies:  Allergies  Allergen Reactions  . Bactrim [Sulfamethoxazole-Trimethoprim] Swelling, SJS     (Not in a hospital admission)  Blood pressure 135/91, pulse 50, temperature 97.8 F (36.6 C), temperature source Oral, resp. rate 18, SpO2 100 %. Physical Exam: General: slightly obese white female who is laying in bed in NAD HEENT: head is normocephalic, atraumatic.  Sclera are noninjected.  PERRL.  Ears and nose without any masses or lesions.  Mouth is pink and moist Heart: regular, rate, and rhythm.  Normal s1,s2. No obvious murmurs, gallops, or rubs noted.  Palpable radial and pedal pulses bilaterally Lungs: CTAB, no wheezes, rhonchi, or rales noted.  Respiratory effort nonlabored Abd: soft, NT, but states palpation makes her feel like she is going to throw up, ND, +BS, no  masses, hernias, or organomegaly MS: all 4 extremities are symmetrical with no cyanosis, clubbing, or edema. Skin: warm and dry with no masses, lesions, or rashes Psych: A&Ox3 with an appropriate affect.    Results for orders placed or performed during the hospital encounter of 12/27/15 (from the past 48 hour(s))  Lipase, blood     Status: None   Collection Time: 12/27/15 11:25 AM  Result Value Ref Range   Lipase 24 11 - 51 U/L  Comprehensive metabolic panel     Status: Abnormal   Collection Time: 12/27/15 11:25 AM  Result Value Ref Range   Sodium 141 135 - 145 mmol/L   Potassium 4.5 3.5 - 5.1 mmol/L   Chloride 109 101 - 111 mmol/L   CO2 22 22 - 32 mmol/L   Glucose, Bld 146 (H) 65 - 99 mg/dL   BUN 15 6 - 20 mg/dL   Creatinine, Ser 0.93 0.44 - 1.00 mg/dL   Calcium 9.4 8.9 - 10.3 mg/dL   Total Protein 7.4 6.5 - 8.1 g/dL   Albumin 4.4 3.5 - 5.0 g/dL   AST 17 15 - 41 U/L   ALT 13 (L) 14 - 54 U/L   Alkaline Phosphatase 60 38 - 126 U/L   Total Bilirubin 0.8 0.3 - 1.2 mg/dL   GFR calc non Af Amer >60 >60 mL/min   GFR calc Af Amer >60 >60 mL/min    Comment: (NOTE) The eGFR has  been calculated using the CKD EPI equation. This calculation has not been validated in all clinical situations. eGFR's persistently <60 mL/min signify possible Chronic Kidney Disease.    Anion gap 10 5 - 15  CBC     Status: None   Collection Time: 12/27/15 11:25 AM  Result Value Ref Range   WBC 8.7 4.0 - 10.5 K/uL   RBC 4.80 3.87 - 5.11 MIL/uL   Hemoglobin 15.0 12.0 - 15.0 g/dL   HCT 43.9 36.0 - 46.0 %   MCV 91.5 78.0 - 100.0 fL   MCH 31.3 26.0 - 34.0 pg   MCHC 34.2 30.0 - 36.0 g/dL   RDW 13.9 11.5 - 15.5 %   Platelets 289 150 - 400 K/uL  I-Stat beta hCG blood, ED ([facility], [facility], [facility] only)     Status: None   Collection Time: 12/27/15 11:34 AM  Result Value Ref Range   I-stat hCG, quantitative <5.0 <5 mIU/mL   Comment 3            Comment:   GEST. AGE      CONC.  (mIU/mL)   <=1 WEEK        5 - 50     2  WEEKS       50 - 500     3 WEEKS       100 - 10,000     4 WEEKS     1,000 - 30,000        FEMALE AND NON-PREGNANT FEMALE:     LESS THAN 5 mIU/mL    US Abdomen Complete  12/27/2015  CLINICAL DATA:  Abdominal pain. EXAM: ABDOMEN ULTRASOUND COMPLETE COMPARISON:  Prior ultrasound on 04/21/2006 FINDINGS: Gallbladder: Interval development of cholelithiasis with a large dominant gallstone measuring at least 2.5 cm within the gallbladder lumen and potentially lodged in the gallbladder neck. There is evidence of edema in the gallbladder wall with thickness measurement of approximately 4 - 5 mm. Lack of elicitation of a sonographic Murphy's sign was noted by the sonographer. It was noted that the patient had been given pain medication just prior to the procedure. Common bile duct: Diameter: Top-normal common bile duct caliber with maximum measured diameter of 7 mm. No evidence of visualized choledocholithiasis. Liver: No focal lesion identified. Within normal limits in parenchymal echogenicity. No evidence of intrahepatic biliary ductal dilatation. IVC: No abnormality visualized. Pancreas: Visualized portion unremarkable. Spleen: Size and appearance within normal limits. Right Kidney: Length: 10.1 cm. Echogenicity within normal limits. No mass or hydronephrosis visualized. Left Kidney: Length: 10.5 cm. Echogenicity within normal limits. No mass or hydronephrosis visualized. Abdominal aorta: No aneurysm visualized. Other findings: None. IMPRESSION: 1. Cholelithiasis with a dominant large gallstone identified which may be partially lodged in the neck of the gallbladder. Associated gallbladder wall edema and thickening is suggestive of cholecystitis. 2. Top-normal common bile duct diameter without sonographic evidence of choledocholithiasis. Electronically Signed   By: Aletta Edouard M.D.   On: 12/27/2015 12:33       Assessment/Plan 1. Biliary colic -admit, IVFs, anti-emetics, analgesics -plan for lap chole  tomorrow -will start on some Rocephin -keep NPO given severe nausea  Alivea Gladson E 12/27/2015, 1:32 PM Pager: [phone]

## 2015-12-27 NOTE — ED Notes (Signed)
Patient went to restroom but would not give [place] a urine sample.  She is aware we need urine.

## 2015-12-27 NOTE — ED Notes (Addendum)
Per EMS-abdominal pain and vomiting since last night-4 mg of Zofran IM in route

## 2015-12-27 NOTE — ED Provider Notes (Signed)
CSN: [id number]     Arrival date & time 12/27/15  1052 History   First MD Initiated Contact with Patient 12/27/15 1105     Chief Complaint  Patient presents with  . Abdominal Pain     (Consider location/radiation/quality/duration/timing/severity/associated sxs/prior Treatment) HPI Comments: Patient here with 12 hour history of abdominal pain located at her epigastric area with radiation to her back. Does have a history of gallbladder sludge. Has had multiple episodes of emesis without fever or chills. No black or bloody stools. Denies any anginal type symptoms. No urinary symptoms. No vaginal burning or discharge. Called EMS and was given Zofran without relief.  Patient is a 40 y.o. female presenting with abdominal pain. The history is provided by the patient.  Abdominal Pain   Past Medical History  Diagnosis Date  . Degenerative disc disease    Past Surgical History  Procedure Laterality Date  . Cesarean section    . Tubal ligation     Family History  Problem Relation Age of Onset  . Diabetes Mother   . Hypertension Mother   . Stroke Brother    Social History  Substance Use Topics  . Smoking status: Current Every Day Smoker -- 0.50 packs/day    Types: Cigarettes  . Smokeless tobacco: Never Used  . Alcohol Use: No   OB History    No data available     Review of Systems  Gastrointestinal: Positive for abdominal pain.  All other systems reviewed and are negative.     Allergies  Bactrim  Home Medications   Prior to Admission medications   Medication Sig Start Date End Date Taking? Authorizing Provider  acetaminophen (TYLENOL) 500 MG tablet Take 1,000 mg by mouth every 6 (six) hours as needed for mild pain.    Historical Provider, MD  acetaminophen (TYLENOL) 650 MG CR tablet Take 1,300 mg by mouth every 8 (eight) hours as needed for pain.    Historical Provider, MD  cyclobenzaprine (FLEXERIL) 10 MG tablet Take 1 tablet (10 mg total) by mouth 2 (two) times daily  as needed for muscle spasms. Patient not taking: Reported on 01/14/2015 08/01/14   Roxy Horseman, PA-C  erythromycin ophthalmic ointment Place a 1/2 inch ribbon of ointment into the lower eyelid q 6 hrs daily for 7 days. 01/14/15   Purvis Sheffield, MD  HYDROcodone-acetaminophen (NORCO/VICODIN) 5-325 MG per tablet Take 1-2 tablets by mouth every 6 (six) hours as needed for moderate pain or severe pain. Patient not taking: Reported on 01/14/2015 08/01/14   Roxy Horseman, PA-C  ibuprofen (ADVIL,MOTRIN) 200 MG tablet Take 800 mg by mouth every 6 (six) hours as needed for moderate pain.     Historical Provider, MD  tobramycin-dexamethasone [facility]) ophthalmic solution Place 1 drop into both eyes every 4 (four) hours while awake.    Historical Provider, MD   BP 168/97 mmHg  Pulse 52  Temp(Src)   Resp 16  SpO2 100% Physical Exam  Constitutional: She is oriented to person, place, and time. She appears well-developed and well-nourished.  Non-toxic appearance. No distress.  HENT:  Head: Normocephalic and atraumatic.  Eyes: Conjunctivae, EOM and lids are normal. Pupils are equal, round, and reactive to light.  Neck: Normal range of motion. Neck supple. No tracheal deviation present. No thyroid mass present.  Cardiovascular: Normal rate, regular rhythm and normal heart sounds.  Exam reveals no gallop.   No murmur heard. Pulmonary/Chest: Effort normal and breath sounds normal. No stridor. No respiratory distress. She has no decreased  breath sounds. She has no wheezes. She has no rhonchi. She has no rales.  Abdominal: Soft. Normal appearance and bowel sounds are normal. She exhibits no distension. There is tenderness in the right upper quadrant and epigastric area. There is no rigidity, no rebound, no guarding and no CVA tenderness.  Musculoskeletal: Normal range of motion. She exhibits no edema or tenderness.  Neurological: She is alert and oriented to person, place, and time. She has normal strength. No  cranial nerve deficit or sensory deficit. GCS eye subscore is 4. GCS verbal subscore is 5. GCS motor subscore is 6.  Skin: Skin is warm and dry. No abrasion and no rash noted.  Psychiatric: She has a normal mood and affect. Her speech is normal and behavior is normal.  Nursing note and vitals reviewed.   ED Course  Procedures (including critical care time) Labs Review Labs Reviewed  LIPASE, BLOOD  COMPREHENSIVE METABOLIC PANEL  CBC  URINALYSIS, ROUTINE W REFLEX MICROSCOPIC (NOT AT [facility])  I-STAT BETA HCG BLOOD, ED ([facility], [facility], [facility] ONLY)    Imaging Review No results found. I have personally reviewed and evaluated these images and lab results as part of my medical decision-making.   EKG Interpretation None      MDM   Final diagnoses:  None    Patient given pain meds here and this was slightly better but continues to endorse nausea. Ultrasound results noted and counseled general surgery obtained for acute cholecystitis    Lorre Nick, MD 12/27/15 1332

## 2015-12-28 ENCOUNTER — Inpatient Hospital Stay (HOSPITAL_COMMUNITY): Payer: Medicare Other | Admitting: Anesthesiology

## 2015-12-28 ENCOUNTER — Inpatient Hospital Stay (HOSPITAL_COMMUNITY): Payer: Medicare Other

## 2015-12-28 ENCOUNTER — Encounter (HOSPITAL_COMMUNITY): Admission: EM | Disposition: A | Discharge status: Home / Self Care | Payer: Self-pay | Source: Home / Self Care

## 2015-12-28 ENCOUNTER — Encounter (HOSPITAL_COMMUNITY): Payer: Self-pay | Admitting: Anesthesiology

## 2015-12-28 HISTORY — PX: CHOLECYSTECTOMY: SHX55

## 2015-12-28 LAB — COMPREHENSIVE METABOLIC PANEL
ALBUMIN: 3.7 g/dL (ref 3.5–5.0)
ALT: 11 U/L — ABNORMAL LOW (ref 14–54)
ANION GAP: 9 (ref 5–15)
AST: 15 U/L (ref 15–41)
Alkaline Phosphatase: 51 U/L (ref 38–126)
BUN: 8 mg/dL (ref 6–20)
CHLORIDE: 113 mmol/L — AB (ref 101–111)
CO2: 21 mmol/L — AB (ref 22–32)
Calcium: 8.8 mg/dL — ABNORMAL LOW (ref 8.9–10.3)
Creatinine, Ser: 0.87 mg/dL (ref 0.44–1.00)
GFR calc non Af Amer: >60 mL/min (ref >60)
Glucose, Bld: 101 mg/dL — ABNORMAL HIGH (ref 65–99)
Potassium: 4.2 mmol/L (ref 3.5–5.1)
SODIUM: 143 mmol/L (ref 135–145)
Total Bilirubin: 0.7 mg/dL (ref 0.3–1.2)
Total Protein: 6.2 g/dL — ABNORMAL LOW (ref 6.5–8.1)

## 2015-12-28 LAB — CBC
HCT: 41.3 % (ref 36.0–46.0)
Hemoglobin: 13.9 g/dL (ref 12.0–15.0)
MCH: 31 pg (ref 26.0–34.0)
MCHC: 33.7 g/dL (ref 30.0–36.0)
MCV: 92 fL (ref 78.0–100.0)
PLATELETS: 281 10*3/uL (ref 150–400)
RBC: 4.49 MIL/uL (ref 3.87–5.11)
RDW: 14 % (ref 11.5–15.5)
WBC: 17.8 10*3/uL — ABNORMAL HIGH (ref 4.0–10.5)

## 2015-12-28 LAB — SURGICAL PCR SCREEN
MRSA, PCR: NEGATIVE
STAPHYLOCOCCUS AUREUS: NEGATIVE

## 2015-12-28 LAB — LIPASE, BLOOD: Lipase: 37 U/L (ref 11–51)

## 2015-12-28 SURGERY — LAPAROSCOPIC CHOLECYSTECTOMY WITH INTRAOPERATIVE CHOLANGIOGRAM
Anesthesia: General | Site: Abdomen | Laterality: Right

## 2015-12-28 SURGERY — LAPAROSCOPIC CHOLECYSTECTOMY WITH INTRAOPERATIVE CHOLANGIOGRAM
Anesthesia: General

## 2015-12-28 MED ORDER — SUGAMMADEX SODIUM 200 MG/2ML IV SOLN
INTRAVENOUS | Status: DC | PRN
  Administered 2015-12-28: 200 mg via INTRAVENOUS

## 2015-12-28 MED ORDER — FENTANYL CITRATE (PF) 100 MCG/2ML IJ SOLN
INTRAMUSCULAR | Status: DC | PRN
  Administered 2015-12-28 (×7): 50 ug via INTRAVENOUS

## 2015-12-28 MED ORDER — LABETALOL HCL 5 MG/ML IV SOLN
INTRAVENOUS | Status: DC | PRN
  Administered 2015-12-28: 2.5 mg via INTRAVENOUS

## 2015-12-28 MED ORDER — HYDROMORPHONE HCL 1 MG/ML IJ SOLN
INTRAMUSCULAR | Status: AC
  Filled 2015-12-28: qty 1

## 2015-12-28 MED ORDER — ONDANSETRON HCL 4 MG/2ML IJ SOLN
INTRAMUSCULAR | Status: DC | PRN
  Administered 2015-12-28: 4 mg via INTRAVENOUS

## 2015-12-28 MED ORDER — FENTANYL CITRATE (PF) 250 MCG/5ML IJ SOLN
INTRAMUSCULAR | Status: AC
  Filled 2015-12-28: qty 5

## 2015-12-28 MED ORDER — PROPOFOL 10 MG/ML IV BOLUS
INTRAVENOUS | Status: AC
  Filled 2015-12-28: qty 20

## 2015-12-28 MED ORDER — SODIUM CHLORIDE 0.9 % IJ SOLN
INTRAMUSCULAR | Status: AC
  Filled 2015-12-28: qty 10

## 2015-12-28 MED ORDER — DEXAMETHASONE SODIUM PHOSPHATE 10 MG/ML IJ SOLN
INTRAMUSCULAR | Status: DC | PRN
  Administered 2015-12-28: 10 mg via INTRAVENOUS

## 2015-12-28 MED ORDER — PROPOFOL 10 MG/ML IV BOLUS
INTRAVENOUS | Status: DC | PRN
  Administered 2015-12-28: 200 mg via INTRAVENOUS

## 2015-12-28 MED ORDER — LIDOCAINE HCL (CARDIAC) 20 MG/ML IV SOLN
INTRAVENOUS | Status: DC | PRN
  Administered 2015-12-28: 100 mg via INTRAVENOUS

## 2015-12-28 MED ORDER — 0.9 % SODIUM CHLORIDE (POUR BTL) OPTIME
TOPICAL | Status: DC | PRN
  Administered 2015-12-28: 1000 mL

## 2015-12-28 MED ORDER — SUCCINYLCHOLINE CHLORIDE 20 MG/ML IJ SOLN
INTRAMUSCULAR | Status: DC | PRN
  Administered 2015-12-28: 100 mg via INTRAVENOUS

## 2015-12-28 MED ORDER — SUGAMMADEX SODIUM 200 MG/2ML IV SOLN
INTRAVENOUS | Status: AC
  Filled 2015-12-28: qty 2

## 2015-12-28 MED ORDER — ROCURONIUM BROMIDE 100 MG/10ML IV SOLN
INTRAVENOUS | Status: DC | PRN
  Administered 2015-12-28: 10 mg via INTRAVENOUS
  Administered 2015-12-28: 5 mg via INTRAVENOUS
  Administered 2015-12-28: 30 mg via INTRAVENOUS

## 2015-12-28 MED ORDER — MIDAZOLAM HCL 2 MG/2ML IJ SOLN
INTRAMUSCULAR | Status: AC
  Filled 2015-12-28: qty 2

## 2015-12-28 MED ORDER — FENTANYL CITRATE (PF) 100 MCG/2ML IJ SOLN
INTRAMUSCULAR | Status: AC
  Filled 2015-12-28: qty 2

## 2015-12-28 MED ORDER — OXYCODONE-ACETAMINOPHEN 5-325 MG PO TABS
1.0000 | ORAL_TABLET | ORAL | Status: DC | PRN
  Administered 2015-12-29: 2 via ORAL
  Filled 2015-12-28: qty 2

## 2015-12-28 MED ORDER — IOHEXOL 300 MG/ML  SOLN
INTRAMUSCULAR | Status: DC | PRN
  Administered 2015-12-28: 6 mL

## 2015-12-28 MED ORDER — BUPIVACAINE-EPINEPHRINE 0.5% -1:200000 IJ SOLN
INTRAMUSCULAR | Status: DC | PRN
Start: 2015-12-28 — End: 2015-12-28
  Administered 2015-12-28: 20 mL

## 2015-12-28 MED ORDER — LABETALOL HCL 5 MG/ML IV SOLN
INTRAVENOUS | Status: AC
  Filled 2015-12-28: qty 4

## 2015-12-28 MED ORDER — LACTATED RINGERS IR SOLN
Status: DC | PRN
  Administered 2015-12-28: 1000 mL

## 2015-12-28 MED ORDER — PROMETHAZINE HCL 25 MG/ML IJ SOLN: 6.2500 mg | INTRAMUSCULAR | Status: DC | PRN

## 2015-12-28 MED ORDER — ONDANSETRON HCL 4 MG/2ML IJ SOLN
INTRAMUSCULAR | Status: AC
  Filled 2015-12-28: qty 2

## 2015-12-28 MED ORDER — ENOXAPARIN SODIUM 40 MG/0.4ML ~~LOC~~ SOLN
40.0000 mg | SUBCUTANEOUS | Status: DC
  Administered 2015-12-29: 40 mg via SUBCUTANEOUS
  Filled 2015-12-28 (×2): qty 0.4

## 2015-12-28 MED ORDER — ACETAMINOPHEN 10 MG/ML IV SOLN
INTRAVENOUS | Status: AC
  Filled 2015-12-28: qty 100

## 2015-12-28 MED ORDER — MIDAZOLAM HCL 5 MG/5ML IJ SOLN
INTRAMUSCULAR | Status: DC | PRN
  Administered 2015-12-28: 2 mg via INTRAVENOUS

## 2015-12-28 MED ORDER — LACTATED RINGERS IV SOLN
INTRAVENOUS | Status: DC
  Administered 2015-12-28: 1000 mL via INTRAVENOUS

## 2015-12-28 MED ORDER — DEXAMETHASONE SODIUM PHOSPHATE 10 MG/ML IJ SOLN
INTRAMUSCULAR | Status: AC
  Filled 2015-12-28: qty 1

## 2015-12-28 MED ORDER — HYDROMORPHONE HCL 1 MG/ML IJ SOLN
0.2500 mg | INTRAMUSCULAR | Status: DC | PRN
  Administered 2015-12-28: 0.25 mg via INTRAVENOUS
  Administered 2015-12-28: 0.5 mg via INTRAVENOUS
  Administered 2015-12-28: 0.25 mg via INTRAVENOUS
  Administered 2015-12-28 (×2): 0.5 mg via INTRAVENOUS

## 2015-12-28 MED ORDER — ACETAMINOPHEN 10 MG/ML IV SOLN
1000.0000 mg | Freq: Once | INTRAVENOUS | Status: AC
  Administered 2015-12-28: 1000 mg via INTRAVENOUS

## 2015-12-28 MED ORDER — EPHEDRINE SULFATE 50 MG/ML IJ SOLN
INTRAMUSCULAR | Status: AC
  Filled 2015-12-28: qty 1

## 2015-12-28 MED ORDER — LIDOCAINE HCL (CARDIAC) 20 MG/ML IV SOLN
INTRAVENOUS | Status: AC
  Filled 2015-12-28: qty 5

## 2015-12-28 MED ORDER — BUPIVACAINE-EPINEPHRINE (PF) 0.5% -1:200000 IJ SOLN
INTRAMUSCULAR | Status: AC
  Filled 2015-12-28: qty 30

## 2015-12-28 SURGICAL SUPPLY — 34 items
ADH SKN CLS APL DERMABOND .7 (GAUZE/BANDAGES/DRESSINGS) ×1
APL SKNCLS STERI-STRIP NONHPOA (GAUZE/BANDAGES/DRESSINGS)
APPLIER CLIP ROT 10 11.4 M/L (STAPLE) ×3
APR CLP MED LRG 11.4X10 (STAPLE) ×1
BAG SPEC RTRVL LRG 6X4 10 (ENDOMECHANICALS) ×1
BENZOIN TINCTURE PRP APPL 2/3 (GAUZE/BANDAGES/DRESSINGS) IMPLANT
CLIP APPLIE ROT 10 11.4 M/L (STAPLE) ×1 IMPLANT
CLOSURE WOUND 1/2 X4 (GAUZE/BANDAGES/DRESSINGS)
COVER MAYO STAND STRL (DRAPES) ×3 IMPLANT
COVER SURGICAL LIGHT HANDLE (MISCELLANEOUS) ×3 IMPLANT
DECANTER SPIKE VIAL GLASS SM (MISCELLANEOUS) ×3 IMPLANT
DERMABOND ADVANCED (GAUZE/BANDAGES/DRESSINGS) ×2
DERMABOND ADVANCED .7 DNX12 (GAUZE/BANDAGES/DRESSINGS) IMPLANT
DRAPE C-ARM 42X120 X-RAY (DRAPES) ×3 IMPLANT
DRAPE LAPAROSCOPIC ABDOMINAL (DRAPES) ×3 IMPLANT
ELECT REM PT RETURN 9FT ADLT (ELECTROSURGICAL) ×3
ELECTRODE REM PT RTRN 9FT ADLT (ELECTROSURGICAL) ×1 IMPLANT
GLOVE EUDERMIC 7 POWDERFREE (GLOVE) ×3 IMPLANT
GOWN STRL REUS W/TWL XL LVL3 (GOWN DISPOSABLE) ×12 IMPLANT
HEMOSTAT SNOW SURGICEL 2X4 (HEMOSTASIS) ×2 IMPLANT
KIT BASIN OR (CUSTOM PROCEDURE TRAY) ×3 IMPLANT
POUCH SPECIMEN RETRIEVAL 10MM (ENDOMECHANICALS) ×3 IMPLANT
SCISSORS LAP 5X35 DISP (ENDOMECHANICALS) ×3 IMPLANT
SET CHOLANGIOGRAPH MIX (MISCELLANEOUS) ×3 IMPLANT
SET IRRIG TUBING LAPAROSCOPIC (IRRIGATION / IRRIGATOR) ×3 IMPLANT
SLEEVE XCEL OPT CAN 5 100 (ENDOMECHANICALS) ×3 IMPLANT
STRIP CLOSURE SKIN 1/2X4 (GAUZE/BANDAGES/DRESSINGS) IMPLANT
SUT MNCRL AB 4-0 PS2 18 (SUTURE) ×3 IMPLANT
TOWEL OR 17X26 10 PK STRL BLUE (TOWEL DISPOSABLE) ×3 IMPLANT
TOWEL OR NON WOVEN STRL DISP B (DISPOSABLE) ×3 IMPLANT
TRAY LAPAROSCOPIC (CUSTOM PROCEDURE TRAY) ×3 IMPLANT
TROCAR BLADELESS OPT 5 100 (ENDOMECHANICALS) ×3 IMPLANT
TROCAR XCEL BLUNT TIP 100MML (ENDOMECHANICALS) ×3 IMPLANT
TROCAR XCEL NON-BLD 11X100MML (ENDOMECHANICALS) ×3 IMPLANT

## 2015-12-28 NOTE — Anesthesia Preprocedure Evaluation (Addendum)
Anesthesia Evaluation  Patient identified by MRN, date of birth, ID band Patient awake    Reviewed: Allergy & Precautions, NPO status , Patient's Chart, lab work & pertinent test results  Airway Mallampati: II  TM Distance: >3 FB Neck ROM: Full    Dental no notable dental hx.    Pulmonary Current Smoker,    Pulmonary exam normal breath sounds clear to auscultation       Cardiovascular negative cardio ROS Normal cardiovascular exam Rhythm:Regular Rate:Normal     Neuro/Psych negative neurological ROS  negative psych ROS   GI/Hepatic negative GI ROS, Neg liver ROS,   Endo/Other  negative endocrine ROS  Renal/GU negative Renal ROS  negative genitourinary   Musculoskeletal  (+) Arthritis ,   Abdominal   Peds negative pediatric ROS (+)  Hematology negative hematology ROS (+)   Anesthesia Other Findings   Reproductive/Obstetrics negative OB ROS                            Anesthesia Physical Anesthesia Plan  ASA: II  Anesthesia Plan: General   Post-op Pain Management:    Induction: Intravenous  Airway Management Planned: Oral ETT  Additional Equipment:   Intra-op Plan:   Post-operative Plan: Extubation in OR  Informed Consent: I have reviewed the patients History and Physical, chart, labs and discussed the procedure including the risks, benefits and alternatives for the proposed anesthesia with the patient or authorized representative who has indicated his/her understanding and acceptance.   Dental advisory given  Plan Discussed with: CRNA  Anesthesia Plan Comments:         Anesthesia Quick Evaluation

## 2015-12-28 NOTE — Anesthesia Procedure Notes (Signed)
Procedure Name: Intubation Date/Time: 12/28/2015 1:46 PM Performed by: Jarvis Newcomer A Pre-anesthesia Checklist: Patient identified, Timeout performed, Emergency Drugs available, Suction available and Patient being monitored Patient Re-evaluated:Patient Re-evaluated prior to inductionOxygen Delivery Method: Circle system utilized Preoxygenation: Pre-oxygenation with 100% oxygen Intubation Type: IV induction Ventilation: Mask ventilation without difficulty Laryngoscope Size: Mac and 3 Grade View: Grade I Tube type: Oral Tube size: 7.5 mm Number of attempts: 1 Airway Equipment and Method: Stylet Placement Confirmation: ETT inserted through vocal cords under direct vision,  breath sounds checked- equal and bilateral and positive ETCO2 Secured at: 21 cm Tube secured with: Tape Dental Injury: Teeth and Oropharynx as per pre-operative assessment  Comments: ATOI by Dr. Council Mechanic

## 2015-12-28 NOTE — Op Note (Signed)
Patient Name:           [name]   Date of Surgery:        12/28/2015   Pre op Diagnosis:      Acute and chronic cholecystitis with cholelithiasis  Post op Diagnosis:    Same  Procedure:                 Laparoscopic cholecystectomy with cholangiogram  Surgeon:                     Angelia Mould. Derrell Lolling, M.D., FACS  Assistant:                      Barnetta Chapel, PA  Operative Indications:   This is a 40 year old woman who has been having abdominal pain attacks very typical for biliary colic off and on for some time.  She was admitted to the hospital yesterday with a more severe attack.  Abdominal exam reveals minimal tenderness.  Ultrasound shows a large stone impacted in the neck of the gallbladder.  Common bile duct is borderline dilated but her liver function tests were normal.  She was admitted yesterday afternoon for bowel rest, hydration, and IV antibiotics.  She is brought to the operating room for cholecystectomy  Operative Findings:       Gallbladder was most notable for chronic inflammation and dense adherence to the bed of the gallbladder with increased bleeding from chronic inflammation.  The gallbladder wall was a little bit edematous but was not gangrenous and with was not purulent.  The intraoperative cholangiogram was normal, showing normal intrahepatic and extrahepatic biliary anatomy, no filling defect, and good flow of contrast into the duodenum.  The liver, stomach, and duodenum otherwise look healthy.  Large bowel small bowel are grossly normal.  Procedure in Detail:          Following the induction of general endotracheal anesthesia the patient's abdomen was prepped and draped in a sterile fashion.  Intravenous antibiotics were given preoperatively.  Surgical timeout was performed.  0.5% Marcaine with epinephrine was used as local infiltration anesthetic.    A vertical incision was made in the lower rim of the umbilicus.  The fascia was incised and the abdomen entered  under direct vision.  An 11 mm Hassan trocar was inserted and secured with the Purstring suture of 0 Vicryl.  Pneumoperitoneum was created and video camera was inserted.  An 11 mm trocar was placed in subxiphoid region and 25 mm trochars placed in the right upper quadrant.  The gallbladder fundus was elevated.  There were a lot of chronic adhesions to the gallbladder which were slowly taken down.  We ultimately were able to identify the infundibulum and mobilize it up.  We with able to dissected the neck of the gallbladder and identify the cystic duct and the cystic artery.  We created a window behind both of the structures.  The cholangiogram catheter was inserted into the cystic duct and a cholangiogram was obtained using the C-arm.  The cholangiogram was normal as described above.  The cholangiogram catheter was removed, the cystic duct was secured with multiple medical clips and divided.  The cystic artery was clipped with metal clips and divided.  A posterior branch of the cystic artery and secured that as well.  Gallbladder was dissected from his bed with electrocautery placed in a specimen bag and removed.  We had to do an increased amount of electrocautery in  the bed of the gallbladder but nothing excessive.  We irrigated out the operative field copiously and removed all the fluid and bloody  material.  Bed of the gallbladder looked hemostatic but I chose to put a piece of hemostatic sponge there and after a few minutes it seems dry.  All the fluid was removed.    Survey of 4 quadrants of the abdomen was unremarkable.  The pneumoperitoneum was released and the trochars removed.  The fascia at the umbilicus was closed with 0 Vicryl sutures.  The skin incisions were closed with subcuticular sutures of 4-0 Monocryl and Dermabond.  Patient tolerated procedure well was taken to PACU in stable condition.  EBL 30-40 mL.  Counts correct.  Complications none.     Angelia Mould. Derrell Lolling, M.D., FACS General and  Minimally Invasive Surgery Breast and Colorectal Surgery  12/28/2015 3:22 PM

## 2015-12-28 NOTE — Progress Notes (Signed)
Pt arrived back onto the unit from PACU in the bed at about 1700. VSS, 100% RA, all lap sites CDI x 4.

## 2015-12-28 NOTE — Transfer of Care (Signed)
Immediate Anesthesia Transfer of Care Note  Patient: [name]  Procedure(s) Performed: Procedure(s): LAPAROSCOPIC CHOLECYSTECTOMY WITH INTRAOPERATIVE CHOLANGIOGRAM (Right)  Patient Location: PACU  Anesthesia Type:General  Level of Consciousness: awake, alert , oriented and patient cooperative  Airway & Oxygen Therapy: Patient Spontanous Breathing and Patient connected to face mask oxygen  Post-op Assessment: Report given to RN, Post -op Vital signs reviewed and stable and Patient moving all extremities  Post vital signs: Reviewed and stable  Last Vitals:  Filed Vitals:   12/28/15 1000 12/28/15 1235  BP: 124/64 123/93  Pulse: 72 64  Temp: 37 C 37.3 C  Resp: 18 16    Complications: No apparent anesthesia complications

## 2015-12-28 NOTE — Progress Notes (Addendum)
Pts staff emergency call light ringing out. Myself and another RN, Gunnar Fusi, went into the room to see what was going on. There were 2 young children in the room crying, along with the daughter and the presumed sister. I asked the sister, "who had hit the staff emergency button?" and the sister became hysterical and yelled at the writer that they had been hitting the call bell button but no one had come. Someone had called out from the room approximately five minutes before this but I was at the nursing station talking with my pt from 1535. Felicia the NS can verify this. After assessing the pt and her VSS and she was 100% RA, I went to get her pain and nausea meds bc she was stating that she was in pain, causing her not to be able to breath. Before I could even retrieve the meds, the sister had come back up to the desk asking what was taking so long and the NS and NT informed her that the meds had to be pulled from the pyxis bc the nurses did not carrying meds  around with them. When I arrived back to the room the sister was taking the children in the room downstairs to leave and left the room. The daughter, however, was still sitting in the room on the window seal when I drew up both the  of IV morphine, and  of IV zofran in the room, scanned the pt bractlet and meds and gave the two IV meds. Pt was made comfortable in the bed and given new ice pack. About this same time the sister arrived back into the room and I informed her of all the pain meds (including what was given in the PACU) that were given to the pt, informed her that all her vitals were stable and she was 100% on RA and that the pt just needed to rest and that the nursing staff would continue to monitor the pt. The sister later came to the desk and told the NS that she had called Jeanene Erb, RN and that he told her that she needed to speak with the charge nurse. Marisue Ivan the charge RN followed up with the pt and the family.

## 2015-12-28 NOTE — Progress Notes (Signed)
Subjective: Patient feels better this morning.  Had one episode of low volume emesis last night but pain is better.  Ambulating.  Smiling more.  Had lots of questions and we talked a long time about the laparoscopic cholecystectomy procedure and she is comfortable with all of this.  Lab work this morning shows leukocytosis with WBC 17,800.  Hemoglobin stable 13.9.  Liver function test remained normal. She is afebrile.  Heart rate 77. Objective: Vital signs in last 24 hours: Temp:  [97.8 F (36.6 C)-99 F (37.2 C)] 98.8 F (37.1 C) (01/19 0544) Pulse Rate:  [50-82] 77 (01/19 0544) Resp:  [16-18] 16 (01/19 0544) BP: (110-168)/(58-97) 141/80 mmHg (01/19 0544) SpO2:  [96 %-100 %] 96 % (01/19 0544) Weight:  [117.935 kg (260 lb)] 117.935 kg (260 lb) (01/18 1809) Last BM Date: 12/25/15  Intake/Output from previous day: 01/18 0701 - 01/19 0700 In: 0  Out: 1000 [Urine:1000] Intake/Output this shift: Total I/O In: 0  Out: 1000 [Urine:1000]  General appearance: Alert.  Cooperative.  Better spirits.  No distress. Resp: clear to auscultation bilaterally GI: Soft.  Nontender.  No mass.  Healed C-section scar.  striae  Lab Results:   Recent Labs  12/27/15 1125 12/28/15 0400  WBC 8.7 17.8*  HGB 15.0 13.9  HCT 43.9 41.3  PLT 289 281   BMET  Recent Labs  12/27/15 1125 12/28/15 0400  NA 141 143  K 4.5 4.2  CL 109 113*  CO2 22 21*  GLUCOSE 146* 101*  BUN 15 8  CREATININE 0.93 0.87  CALCIUM 9.4 8.8*   PT/INR No results for input(s): LABPROT, INR in the last 72 hours. ABG No results for input(s): PHART, HCO3 in the last 72 hours.  Invalid input(s): PCO2, PO2  Studies/Results: US Abdomen Complete  12/27/2015  CLINICAL DATA:  Abdominal pain. EXAM: ABDOMEN ULTRASOUND COMPLETE COMPARISON:  Prior ultrasound on 04/21/2006 FINDINGS: Gallbladder: Interval development of cholelithiasis with a large dominant gallstone measuring at least 2.5 cm within the gallbladder lumen and  potentially lodged in the gallbladder neck. There is evidence of edema in the gallbladder wall with thickness measurement of approximately 4 - 5 mm. Lack of elicitation of a sonographic Murphy's sign was noted by the sonographer. It was noted that the patient had been given pain medication just prior to the procedure. Common bile duct: Diameter: Top-normal common bile duct caliber with maximum measured diameter of 7 mm. No evidence of visualized choledocholithiasis. Liver: No focal lesion identified. Within normal limits in parenchymal echogenicity. No evidence of intrahepatic biliary ductal dilatation. IVC: No abnormality visualized. Pancreas: Visualized portion unremarkable. Spleen: Size and appearance within normal limits. Right Kidney: Length: 10.1 cm. Echogenicity within normal limits. No mass or hydronephrosis visualized. Left Kidney: Length: 10.5 cm. Echogenicity within normal limits. No mass or hydronephrosis visualized. Abdominal aorta: No aneurysm visualized. Other findings: None. IMPRESSION: 1. Cholelithiasis with a dominant large gallstone identified which may be partially lodged in the neck of the gallbladder. Associated gallbladder wall edema and thickening is suggestive of cholecystitis. 2. Top-normal common bile duct diameter without sonographic evidence of choledocholithiasis. Electronically Signed   By: Irish Lack M.D.   On: 12/27/2015 12:33    Anti-infectives: Anti-infectives    Start     Dose/Rate Route Frequency Ordered Stop   12/27/15 1830  cefTRIAXone (ROCEPHIN) 2 g in dextrose 5 % 50 mL IVPB  Status:  Discontinued     2 g 100 mL/hr over 30 Minutes Intravenous Every 24 hours 12/27/15 1822 12/27/15  1824   12/27/15 1630  cefTRIAXone (ROCEPHIN) 2 g in dextrose 5 % 50 mL IVPB     2 g 100 mL/hr over 30 Minutes Intravenous Every 24 hours 12/27/15 1620        Assessment/Plan: s/p Procedure(s): LAPAROSCOPIC CHOLECYSTECTOMY WITH INTRAOPERATIVE CHOLANGIOGRAM  Cholecystitis with  cholelithiasis.    Significance of leukocytosis unclear.  She may have acute cholecystitis but physical exam is unimpressive     Continue IV Rocephin     Proceed with cholecystectomy today  Tobacco abuse History syringe section and tubal ligation  LOS: 1 day    Akoni Parton M 12/28/2015

## 2015-12-29 MED ORDER — OXYCODONE-ACETAMINOPHEN 5-325 MG PO TABS: 1.0000 | ORAL_TABLET | ORAL | Status: AC | PRN

## 2015-12-29 MED ORDER — ONDANSETRON 4 MG PO TBDP: 4.0000 mg | ORAL_TABLET | Freq: Four times a day (QID) | ORAL | Status: AC | PRN

## 2015-12-29 NOTE — Progress Notes (Signed)
12/29/15  0900  Reviewed discharge instructions with patient. Patient verbalized understanding of discharge instructions.  Copy of discharge instructions and prescriptions given to patient.

## 2015-12-29 NOTE — Anesthesia Postprocedure Evaluation (Signed)
Anesthesia Post Note  Patient: [name]  Procedure(s) Performed: Procedure(s) (LRB): LAPAROSCOPIC CHOLECYSTECTOMY WITH INTRAOPERATIVE CHOLANGIOGRAM (Right)  Patient location during evaluation: PACU Anesthesia Type: General Level of consciousness: awake and alert Pain management: pain level controlled Vital Signs Assessment: post-procedure vital signs reviewed and stable Respiratory status: spontaneous breathing, nonlabored ventilation, respiratory function stable and patient connected to nasal cannula oxygen Cardiovascular status: blood pressure returned to baseline and stable Postop Assessment: no signs of nausea or vomiting Anesthetic complications: no    Last Vitals:  Filed Vitals:   12/28/15 2140 12/29/15 0138  BP: 112/61 98/64  Pulse: 59 66  Temp: 36.9 C 37 C  Resp: 18 18    Last Pain:  Filed Vitals:   12/29/15 0434  PainSc: Asleep                 [name] J

## 2015-12-29 NOTE — Discharge Instructions (Signed)

## 2015-12-29 NOTE — Addendum Note (Signed)
Addendum  created 12/29/15 1610 by Elyn Peers, CRNA   Modules edited: Charges VN

## 2015-12-29 NOTE — Progress Notes (Signed)
Gen eral Surgery:  She is doing well and asking when she can go home Tolerating liquid diet.  Voiding without difficulty.  Ambulating in hall.  Has a little bit of right shoulder point pain and I explained that that was due to retained CO2 and would resolve by tomorrow I explained the operative events to her  On exam her abdomen is soft.  Essentially nontender.  All the wounds look good.  Plan: Advance to regular diet now Probable home later today Diet and activities discussed Follow-up in office   [facility]. Derrell Lolling, M.D., [facility] General and Minimally invasive Surgery Breast and Colorectal Surgery Office:   [phone]

## 2015-12-29 NOTE — Discharge Summary (Signed)
Physician Discharge Summary  Patient ID: [name] MRN: [id number] DOB/AGE: Aug 13, 1976 40 y.o.  Admit date: 12/27/2015 Discharge date: 12/29/2015  Admitting Diagnosis: Symptomatic cholelithiasis   Discharge Diagnosis Patient Active Problem List   Diagnosis Date Noted  . Biliary colic 12/27/2015  . Cholecystitis with cholelithiasis 12/27/2015    Consultants none  Imaging: Dg Cholangiogram Operative  12/28/2015  CLINICAL DATA:  Calculus cholecystitis EXAM: INTRAOPERATIVE CHOLANGIOGRAM TECHNIQUE: Cholangiographic images from the C-arm fluoroscopic device were submitted for interpretation post-operatively. Please see the procedural report for the amount of contrast and the fluoroscopy time utilized. COMPARISON:  12/27/2015 FINDINGS: Intraoperative cholangiogram performed during laparoscopic cholecystectomy. The cystic duct, common hepatic duct, and common bile duct are patent. No significant obstruction, dilatation or filling defect. Contrast drains into the duodenum. IMPRESSION: Patent biliary system. Electronically Signed   By: Judie Petit.  Shick M.D.   On: 12/28/2015 15:14   US Abdomen Complete  12/27/2015  CLINICAL DATA:  Abdominal pain. EXAM: ABDOMEN ULTRASOUND COMPLETE COMPARISON:  Prior ultrasound on 04/21/2006 FINDINGS: Gallbladder: Interval development of cholelithiasis with a large dominant gallstone measuring at least 2.5 cm within the gallbladder lumen and potentially lodged in the gallbladder neck. There is evidence of edema in the gallbladder wall with thickness measurement of approximately 4 - 5 mm. Lack of elicitation of a sonographic Murphy's sign was noted by the sonographer. It was noted that the patient had been given pain medication just prior to the procedure. Common bile duct: Diameter: Top-normal common bile duct caliber with maximum measured diameter of 7 mm. No evidence of visualized choledocholithiasis. Liver: No focal lesion identified. Within normal limits in  parenchymal echogenicity. No evidence of intrahepatic biliary ductal dilatation. IVC: No abnormality visualized. Pancreas: Visualized portion unremarkable. Spleen: Size and appearance within normal limits. Right Kidney: Length: 10.1 cm. Echogenicity within normal limits. No mass or hydronephrosis visualized. Left Kidney: Length: 10.5 cm. Echogenicity within normal limits. No mass or hydronephrosis visualized. Abdominal aorta: No aneurysm visualized. Other findings: None. IMPRESSION: 1. Cholelithiasis with a dominant large gallstone identified which may be partially lodged in the neck of the gallbladder. Associated gallbladder wall edema and thickening is suggestive of cholecystitis. 2. Top-normal common bile duct diameter without sonographic evidence of choledocholithiasis. Electronically Signed   By: Irish Lack M.D.   On: 12/27/2015 12:33    Procedures Laparoscopic cholecystectomy with IOC---Dr. Blanchie Serve Course:  Georjean Toya is a 40 year old female who presented to [facility] with abdominal pain.  Workup showed stone in the neck of the gallbladder.  Patient was admitted and underwent procedure listed above.  Tolerated procedure well and was transferred to the floor.  Diet was advanced as tolerated.  On POD#1, the patient was voiding well, tolerating diet, ambulating well, pain well controlled, vital signs stable, incisions c/d/i and felt stable for discharge home.Medication risks, benefits and therapeutic alternatives were reviewed with the patient.  She verbalizes understanding.   Patient will follow up in our office in 2 weeks and knows to call with questions or concerns.  Physical Exam: General:  Alert, NAD, pleasant, comfortable Abd:  Soft, ND, mild tenderness, incisions C/D/I    Medication List    TAKE these medications        ibuprofen 200 MG tablet  Commonly known as:  ADVIL,MOTRIN  Take 800 mg by mouth every 6 (six) hours as needed for moderate pain.      oxyCODONE-acetaminophen 5-325 MG tablet  Commonly known as:  PERCOCET/ROXICET  Take 1-2 tablets by mouth  every 4 (four) hours as needed for moderate pain or severe pain.             Follow-up Information    Follow up with [facility] On 01/16/2016.   Specialty:  General Surgery   Why:  arrive by 1:45PM for a 2:45PM post op check    Contact information:   [address] [place] [address] [phone]       Signed: Ashok Norris, [facility] [phone]  12/29/2015, 8:59 AM

## 2015-12-29 NOTE — Care Management Note (Signed)
Case Management Note  Patient Details  Name: CORNIE MCCOMBER MRN: [id number] Date of Birth: [birth year]  Subjective/Objective:       Admitted with biliary colic             Action/Plan: Discharge planning, no HH needs identified  Expected Discharge Date:   Carmelina Peal)               Expected Discharge Plan:  Home/Self Care  In-House Referral:  NA  Discharge planning Services  CM Consult  Post Acute Care Choice:  NA Choice offered to:  NA  DME Arranged:  N/A DME Agency:  NA  HH Arranged:  NA HH Agency:  NA  Status of Service:  Completed, signed off  Medicare Important Message Given:    Date Medicare IM Given:    Medicare IM give by:    Date Additional Medicare IM Given:    Additional Medicare Important Message give by:     If discussed at Long Length of Stay Meetings, dates discussed:    Additional Comments:  Alexis Goodell, RN 12/29/2015, 9:53 AM [phone]

## 2016-04-16 ENCOUNTER — Emergency Department (HOSPITAL_COMMUNITY)
Admission: EM | Admit: 2016-04-16 | Discharge: 2016-04-16 | Disposition: A | Discharge status: Home / Self Care | Payer: Medicare Other | Attending: Emergency Medicine | Admitting: Emergency Medicine

## 2016-04-16 ENCOUNTER — Encounter (HOSPITAL_COMMUNITY): Payer: Self-pay | Admitting: Emergency Medicine

## 2016-04-16 DIAGNOSIS — Z79891 Long term (current) use of opiate analgesic: Secondary | ICD-10-CM | POA: Diagnosis not present

## 2016-04-16 DIAGNOSIS — Y999 Unspecified external cause status: Secondary | ICD-10-CM | POA: Diagnosis not present

## 2016-04-16 DIAGNOSIS — T161XXA Foreign body in right ear, initial encounter: Secondary | ICD-10-CM | POA: Diagnosis not present

## 2016-04-16 DIAGNOSIS — H6091 Unspecified otitis externa, right ear: Secondary | ICD-10-CM | POA: Diagnosis not present

## 2016-04-16 DIAGNOSIS — Z791 Long term (current) use of non-steroidal anti-inflammatories (NSAID): Secondary | ICD-10-CM | POA: Insufficient documentation

## 2016-04-16 DIAGNOSIS — W458XXA Other foreign body or object entering through skin, initial encounter: Secondary | ICD-10-CM | POA: Diagnosis not present

## 2016-04-16 DIAGNOSIS — Z79899 Other long term (current) drug therapy: Secondary | ICD-10-CM | POA: Insufficient documentation

## 2016-04-16 DIAGNOSIS — Y939 Activity, unspecified: Secondary | ICD-10-CM | POA: Insufficient documentation

## 2016-04-16 DIAGNOSIS — F17210 Nicotine dependence, cigarettes, uncomplicated: Secondary | ICD-10-CM | POA: Diagnosis not present

## 2016-04-16 DIAGNOSIS — Y929 Unspecified place or not applicable: Secondary | ICD-10-CM | POA: Diagnosis not present

## 2016-04-16 MED ORDER — AMOXICILLIN 500 MG PO CAPS: 500.0000 mg | ORAL_CAPSULE | Freq: Three times a day (TID) | ORAL | Status: DC

## 2016-04-16 MED ORDER — CIPROFLOXACIN-DEXAMETHASONE 0.3-0.1 % OT SUSP: 4.0000 [drp] | Freq: Two times a day (BID) | OTIC | Status: AC

## 2016-04-16 NOTE — ED Provider Notes (Signed)
CSN: [id number]     Arrival date & time 04/16/16  1821 History  By signing my name below, I, Soijett Blue, attest that this documentation has been prepared under the direction and in the presence of Roxy Horsemanobert Crisol Muecke, PA-C Electronically Signed: Soijett Blue, ED Scribe. 04/16/2016. 7:59 PM.  Chief Complaint  Patient presents with  . Foreign Body in Ear      The history is provided by the patient. No language interpreter was used.    HPI Comments: [name] is a 40 y.o. female who presents to the Emergency Department complaining of FB in right ear onset 3 weeks. Pt notes that she was using a Q-tip and it was lodged in her right ear. Pt reports that she has tried using bobby pins and pens to try and remove the Q-tip. She states that she is having associated symptoms of dizziness and right sided facial pain. She states that she has tried to remove the Q-tip without medications for the relief for her symptoms. She denies any other symptoms.   Past Medical History  Diagnosis Date  . Degenerative disc disease    Past Surgical History  Procedure Laterality Date  . Cesarean section    . Tubal ligation    . Cholecystectomy Right 12/28/2015    Procedure: LAPAROSCOPIC CHOLECYSTECTOMY WITH INTRAOPERATIVE CHOLANGIOGRAM;  Surgeon: Claud KelpHaywood Ingram, MD;  Location: [facility];  Service: General;  Laterality: Right;   Family History  Problem Relation Age of Onset  . Diabetes Mother   . Hypertension Mother   . Stroke Brother    Social History  Substance Use Topics  . Smoking status: Current Every Day Smoker -- 1.00 packs/day    Types: Cigarettes  . Smokeless tobacco: Never Used  . Alcohol Use: No   OB History    No data available     Review of Systems  Constitutional: Negative for fever.  HENT: Positive for ear pain. Negative for ear discharge.        FB in right ear. Right sided facial pain.   Neurological: Positive for dizziness.  All other systems reviewed and are  negative.     Allergies  Bactrim  Home Medications   Prior to Admission medications   Medication Sig Start Date End Date Taking? Authorizing Provider  ibuprofen (ADVIL,MOTRIN) 200 MG tablet Take 800 mg by mouth every 6 (six) hours as needed for moderate pain.     Historical Provider, MD  ondansetron (ZOFRAN-ODT) 4 MG disintegrating tablet Take 1 tablet (4 mg total) by mouth every 6 (six) hours as needed for nausea. 12/29/15   Emina Riebock, NP  oxyCODONE-acetaminophen (PERCOCET/ROXICET) 5-325 MG tablet Take 1-2 tablets by mouth every 4 (four) hours as needed for moderate pain or severe pain. 12/29/15   Emina Riebock, NP   BP 142/95 mmHg  Pulse 98  Temp(Src) 98 F (36.7 C) (Oral)  Resp 16  Ht 5\' 11"  (1.803 m)  Wt 260 lb (117.935 kg)  BMI 36.28 kg/m2  SpO2 98% Physical Exam  Constitutional: She is oriented to person, place, and time. She appears well-developed and well-nourished. No distress.  HENT:  Head: Normocephalic and atraumatic.  Right Ear: Tympanic membrane is erythematous.  Left Ear: Tympanic membrane normal.  Right ear canal has mild debris and erythema. TM is erythematous but without perforation.   Eyes: EOM are normal.  Neck: Neck supple.  Cardiovascular: Normal rate.   Pulmonary/Chest: Effort normal. No respiratory distress.  Abdominal: She exhibits no distension.  Musculoskeletal: Normal range  of motion.  Neurological: She is alert and oriented to person, place, and time.  Skin: Skin is warm and dry.  Psychiatric: She has a normal mood and affect. Her behavior is normal.  Nursing note and vitals reviewed.   ED Course  Procedures (including critical care time) DIAGNOSTIC STUDIES: Oxygen Saturation is 98% on RA, nl by my interpretation.    COORDINATION OF CARE: 7:50 PM Discussed treatment plan with pt at bedside which includes abx Rx and pt agreed to plan.      MDM   Final diagnoses:  Otitis externa, right  Foreign body in ear, right, initial  encounter   There is a small amount of cotton in right ear canal, the visible portion of this was removed with a curette. Pt presenting with otitis externa. Pt afebrile in NAD. Exam not concerning for mastoiditis, cellulitis or malignant OE. Discharge with ciprodex and amoxicillin. Advised follow up with ENT specialist in 2-3 days if no improvement.  Return precautions discussed. Pt appears safe for discharge.   I personally performed the services described in this documentation, which was scribed in my presence. The recorded information has been reviewed and is accurate.      Roxy Horseman, PA-C 04/16/16 2001  Pricilla Loveless, MD 04/17/16 [phone]

## 2016-04-16 NOTE — Discharge Instructions (Signed)
Ear Foreign Body An ear foreign body is an object that is stuck in your ear. The object is usually stuck in the ear canal. CAUSES In all ages of people, the most common foreign bodies are insects that enter the ear canal. It is common for young children to put objects into the ear canal. These may include pebbles, beads, parts of toys, and any other small objects that fit into the ear. In adults, objects such as cotton swabs may become lodged in the ear canal.  SIGNS AND SYMPTOMS A foreign body in the ear may cause:  Pain.  Buzzing or roaring sounds.  Hearing loss.  Ear drainage or bleeding.  Nausea and vomiting.  A feeling that your ear is full. DIAGNOSIS Your health care provider may be able to diagnose an ear foreign body based on the information that you provide, your symptoms, and a physical exam. Your health care provider may also perform tests, such as testing your hearing and your ear pressure, to check for infection or other problems that are caused by the foreign body in your ear. TREATMENT Treatment depends on what the foreign body is, the location of the foreign body in your ear, and whether or not the foreign body has injured any part of your inner ear. If the foreign body is visible to your health care provider, it may be possible to remove the foreign body using:  A tool, such as medical tweezers (forceps) or a suction tube (catheter).  Irrigation. This uses water to flush the foreign body out of your ear. This is used only if the foreign body is not likely to swell or enlarge when it is put in water. If the foreign body is not visible or your health care provider was not able to remove the foreign body, you may be referred to a specialist for removal. You may also be prescribed antibiotic medicine or ear drops to prevent infection. If the foreign body has caused injury to other parts of your ear, you may need additional treatment. HOME CARE INSTRUCTIONS  Keep all  follow-up visits as directed by your health care provider. This is important.  Take medicines only as directed by your health care provider.  If you were prescribed an antibiotic medicine, finish it all even if you start to feel better. PREVENTION  Keep small objects out of reach of young children. Tell children not to put anything in their ears.  Do not put anything in your ear, including cotton swabs, to clean your ears. Talk to your health care provider about how to clean your ears safely. SEEK MEDICAL CARE IF:  You have a headache.  Your have blood coming from your ear.  You have a fever.  You have increased pain or swelling of your ear.  Your hearing is reduced.  You have discharge coming from your ear.   This information is not intended to replace advice given to you by your health care provider. Make sure you discuss any questions you have with your health care provider.   Document Released: 11/22/2000 Document Revised: 12/16/2014 Document Reviewed: 07/11/2014 Elsevier Interactive Patient Education 2016 Elsevier Inc. Otitis Externa Otitis externa is a bacterial or fungal infection of the outer ear canal. This is the area from the eardrum to the outside of the ear. Otitis externa is sometimes called "swimmer's ear." CAUSES  Possible causes of infection include:  Swimming in dirty water.  Moisture remaining in the ear after swimming or bathing.  Mild injury (  trauma) to the ear.  Objects stuck in the ear (foreign body).  Cuts or scrapes (abrasions) on the outside of the ear. SIGNS AND SYMPTOMS  The first symptom of infection is often itching in the ear canal. Later signs and symptoms may include swelling and redness of the ear canal, ear pain, and yellowish-white fluid (pus) coming from the ear. The ear pain may be worse when pulling on the earlobe. DIAGNOSIS  Your health care provider will perform a physical exam. A sample of fluid may be taken from the ear and  examined for bacteria or fungi. TREATMENT  Antibiotic ear drops are often given for 10 to 14 days. Treatment may also include pain medicine or corticosteroids to reduce itching and swelling. HOME CARE INSTRUCTIONS   Apply antibiotic ear drops to the ear canal as prescribed by your health care provider.  Take medicines only as directed by your health care provider.  If you have diabetes, follow any additional treatment instructions from your health care provider.  Keep all follow-up visits as directed by your health care provider. PREVENTION   Keep your ear dry. Use the corner of a towel to absorb water out of the ear canal after swimming or bathing.  Avoid scratching or putting objects inside your ear. This can damage the ear canal or remove the protective wax that lines the canal. This makes it easier for bacteria and fungi to grow.  Avoid swimming in lakes, polluted water, or poorly chlorinated pools.  You may use ear drops made of rubbing alcohol and vinegar after swimming. Combine equal parts of white vinegar and alcohol in a bottle. Put 3 or 4 drops into each ear after swimming. SEEK MEDICAL CARE IF:   You have a fever.  Your ear is still red, swollen, painful, or draining pus after 3 days.  Your redness, swelling, or pain gets worse.  You have a severe headache.  You have redness, swelling, pain, or tenderness in the area behind your ear. MAKE SURE YOU:   Understand these instructions.  Will watch your condition.  Will get help right away if you are not doing well or get worse.   This information is not intended to replace advice given to you by your health care provider. Make sure you discuss any questions you have with your health care provider.   Document Released: 11/25/2005 Document Revised: 12/16/2014 Document Reviewed: 12/12/2011 Elsevier Interactive Patient Education [employer]2016 Elsevier Inc.

## 2016-04-16 NOTE — ED Notes (Signed)
Pt states that she stuck a q tip in her ear 3 wks ago and it broke off in there.  States she has been getting dizzy because she has been trying to dig in her ear to find the q tip.

## 2016-05-21 ENCOUNTER — Encounter (HOSPITAL_COMMUNITY): Payer: Self-pay | Admitting: *Deleted

## 2016-05-21 ENCOUNTER — Emergency Department (HOSPITAL_COMMUNITY): Payer: Medicare Other

## 2016-05-21 ENCOUNTER — Emergency Department (HOSPITAL_COMMUNITY)
Admission: EM | Admit: 2016-05-21 | Discharge: 2016-05-21 | Disposition: A | Discharge status: Home / Self Care | Payer: Medicare Other | Attending: Emergency Medicine | Admitting: Emergency Medicine

## 2016-05-21 DIAGNOSIS — Z7951 Long term (current) use of inhaled steroids: Secondary | ICD-10-CM | POA: Diagnosis not present

## 2016-05-21 DIAGNOSIS — F17210 Nicotine dependence, cigarettes, uncomplicated: Secondary | ICD-10-CM | POA: Insufficient documentation

## 2016-05-21 DIAGNOSIS — J069 Acute upper respiratory infection, unspecified: Secondary | ICD-10-CM

## 2016-05-21 DIAGNOSIS — Z79891 Long term (current) use of opiate analgesic: Secondary | ICD-10-CM | POA: Diagnosis not present

## 2016-05-21 DIAGNOSIS — R0602 Shortness of breath: Secondary | ICD-10-CM | POA: Diagnosis present

## 2016-05-21 DIAGNOSIS — J45901 Unspecified asthma with (acute) exacerbation: Secondary | ICD-10-CM | POA: Diagnosis not present

## 2016-05-21 MED ORDER — PREDNISONE 20 MG PO TABS
60.0000 mg | ORAL_TABLET | Freq: Once | ORAL | Status: AC
  Administered 2016-05-21: 60 mg via ORAL
  Filled 2016-05-21: qty 3

## 2016-05-21 MED ORDER — ALBUTEROL SULFATE HFA 108 (90 BASE) MCG/ACT IN AERS
2.0000 | INHALATION_SPRAY | Freq: Once | RESPIRATORY_TRACT | Status: AC
  Administered 2016-05-21: 2 via RESPIRATORY_TRACT
  Filled 2016-05-21: qty 6.7

## 2016-05-21 MED ORDER — IPRATROPIUM-ALBUTEROL 0.5-2.5 (3) MG/3ML IN SOLN
3.0000 mL | Freq: Once | RESPIRATORY_TRACT | Status: AC
  Administered 2016-05-21: 3 mL via RESPIRATORY_TRACT
  Filled 2016-05-21: qty 3

## 2016-05-21 MED ORDER — PREDNISONE 20 MG PO TABS: ORAL_TABLET | ORAL | Status: AC

## 2016-05-21 MED ORDER — ALBUTEROL SULFATE (2.5 MG/3ML) 0.083% IN NEBU
5.0000 mg | INHALATION_SOLUTION | Freq: Once | RESPIRATORY_TRACT | Status: AC
Start: 2016-05-21 — End: 2016-05-21
  Administered 2016-05-21: 5 mg via RESPIRATORY_TRACT
  Filled 2016-05-21: qty 6

## 2016-05-21 NOTE — ED Notes (Signed)
Patient transported to X-ray 

## 2016-05-21 NOTE — ED Provider Notes (Signed)
CSN: [id number]     Arrival date & time 05/21/16  1357 History   First MD Initiated Contact with Patient 05/21/16 1432     Chief Complaint  Patient presents with  . Shortness of Breath     (Consider location/radiation/quality/duration/timing/severity/associated sxs/prior Treatment) HPI Patient reports she started with a cold "like everyone else". That was about a week ago. She reports that she started getting increasing coughing and mucus production. She found her inhaler and started to use it yesterday but then it ran out. She reports she's been wheezing. No fevers or chills. Positive nasal congestion and sore throat. No chest pain. Past Medical History  Diagnosis Date  . Degenerative disc disease    Past Surgical History  Procedure Laterality Date  . Cesarean section    . Tubal ligation    . Cholecystectomy Right 12/28/2015    Procedure: LAPAROSCOPIC CHOLECYSTECTOMY WITH INTRAOPERATIVE CHOLANGIOGRAM;  Surgeon: Claud KelpHaywood Ingram, MD;  Location: [facility];  Service: General;  Laterality: Right;   Family History  Problem Relation Age of Onset  . Diabetes Mother   . Hypertension Mother   . Stroke Brother    Social History  Substance Use Topics  . Smoking status: Current Every Day Smoker -- 1.00 packs/day    Types: Cigarettes  . Smokeless tobacco: Never Used  . Alcohol Use: No   OB History    No data available     Review of Systems 10 Systems reviewed and are negative for acute change except as noted in the HPI.    Allergies  Bactrim  Home Medications   Prior to Admission medications   Medication Sig Start Date End Date Taking? Authorizing Provider  albuterol (PROVENTIL HFA;VENTOLIN HFA) 108 (90 Base) MCG/ACT inhaler Inhale 1-2 puffs into the lungs every 6 (six) hours as needed for wheezing or shortness of breath.   Yes Historical Provider, MD  naproxen sodium (ANAPROX) 220 MG tablet Take 220 mg by mouth every 12 (twelve) hours as needed (for pain).   Yes Historical  Provider, MD  amoxicillin (AMOXIL) 500 MG capsule Take 1 capsule (500 mg total) by mouth 3 (three) times daily. Patient not taking: Reported on 05/21/2016 04/16/16   Roxy Horsemanobert Browning, PA-C  ciprofloxacin-dexamethasone [facility](CIPRODEX) otic suspension Place 4 drops into the right ear 2 (two) times daily. Patient not taking: Reported on 05/21/2016 04/16/16   Roxy Horsemanobert Browning, PA-C  ondansetron (ZOFRAN-ODT) 4 MG disintegrating tablet Take 1 tablet (4 mg total) by mouth every 6 (six) hours as needed for nausea. Patient not taking: Reported on 05/21/2016 12/29/15   Ashok NorrisEmina Riebock, NP  oxyCODONE-acetaminophen (PERCOCET/ROXICET) 5-325 MG tablet Take 1-2 tablets by mouth every 4 (four) hours as needed for moderate pain or severe pain. Patient not taking: Reported on 05/21/2016 12/29/15   Emina Riebock, NP   BP 129/100 mmHg  Pulse 77  Temp(Src) 98.2 F (36.8 C) (Oral)  Resp 19  SpO2 94%  LMP 05/10/2016 Physical Exam  Constitutional: She is oriented to person, place, and time. She appears well-developed and well-nourished. No distress.  HENT:  Head: Normocephalic and atraumatic.  Eyes: EOM are normal. Pupils are equal, round, and reactive to light.  Cardiovascular: Normal rate, regular rhythm, normal heart sounds and intact distal pulses.   Pulmonary/Chest:  Mild increased work of breathing. Bilateral wheezes throughout lung fields.  Abdominal: Soft. She exhibits no distension. There is no tenderness.  Musculoskeletal: She exhibits no edema or tenderness.  Neurological: She is alert and oriented to person, place, and time. Coordination normal.  Skin: Skin is warm and dry.  Psychiatric: She has a normal mood and affect.    ED Course  Procedures (including critical care time) Labs Review Labs Reviewed - No data to display  Imaging Review Dg Chest 2 View  05/21/2016  CLINICAL DATA:  Shortness of breath EXAM: CHEST  2 VIEW COMPARISON:  None currently available. 10/31/2008 thoracic spine radiograph. FINDINGS:  Normal heart size and mediastinal contours. No acute infiltrate or edema. No effusion or pneumothorax. Focal lower thoracic advanced disc degeneration with spurring. Cholecystectomy clips. IMPRESSION: No active cardiopulmonary disease. Electronically Signed   By: Marnee Spring M.D.   On: 05/21/2016 14:59   I have personally reviewed and evaluated these images and lab results as part of my medical decision-making.   EKG Interpretation   Date/Time:  Tuesday May 21 2016 14:18:11 EDT Ventricular Rate:  63 PR Interval:  140 QRS Duration: 96 QT Interval:  403 QTC Calculation: 412 R Axis:   -38 Text Interpretation:  Sinus rhythm Left axis deviation Confirmed by  Donnald Garre, MD, [facility] Conners [phone]) on 05/21/2016 2:42:20 PM     Recheck: (15:13) patient reports improvement with her first DuoNeb. She reports she still feels short of breath and wheezing though re-auscultation shows continued wheeze. Patient stable. Alert and nontoxic. Will repeat DuoNeb and given oral prednisone.  Recheck: (16:03) the patient reports more improvement. Auscultation shows continued wheezing but improved aeration of the bases. Will order third DuoNeb and reassessed for possible discharge.  Recheck: (16:55) patient reports that she is much better now. Auscultation is positive for air flow to the bases. Some residual wheeze but much improved. MDM   Final diagnoses:  Asthma exacerbation  URI, acute   Patient presents with URI. She also has history of asthma. Patient presents with wheezing and has run out of her inhaler. Patient had hypoxia to 89% on room air. Patient has responded very well to treatment with DuoNeb 3. Symptoms are consistent with URI and no pneumonia. Chest x-ray is clear. Patient is nontoxic and alert. She is counseled on signs and symptoms for return to the emergency department. She is counseled on follow-up with primary care provider.    Arby Barrette, MD 05/21/16 [phone]

## 2016-05-21 NOTE — ED Notes (Signed)
Pt mentioned lung infection about a year ago.

## 2016-05-21 NOTE — Discharge Instructions (Signed)
Bronchospasm, Adult A bronchospasm is a spasm or tightening of the airways going into the lungs. During a bronchospasm breathing becomes more difficult because the airways get smaller. When this happens there can be coughing, a whistling sound when breathing (wheezing), and difficulty breathing. Bronchospasm is often associated with asthma, but not all patients who experience a bronchospasm have asthma. CAUSES  A bronchospasm is caused by inflammation or irritation of the airways. The inflammation or irritation may be triggered by:   Allergies (such as to animals, pollen, food, or mold). Allergens that cause bronchospasm may cause wheezing immediately after exposure or many hours later.   Infection. Viral infections are believed to be the most common cause of bronchospasm.   Exercise.   Irritants (such as pollution, cigarette smoke, strong odors, aerosol sprays, and paint fumes).   Weather changes. Winds increase molds and pollens in the air. Rain refreshes the air by washing irritants out. Cold air may cause inflammation.   Stress and emotional upset.  SIGNS AND SYMPTOMS   Wheezing.   Excessive nighttime coughing.   Frequent or severe coughing with a simple cold.   Chest tightness.   Shortness of breath.  DIAGNOSIS  Bronchospasm is usually diagnosed through a history and physical exam. Tests, such as chest X-rays, are sometimes done to look for other conditions. TREATMENT   Inhaled medicines can be given to open up your airways and help you breathe. The medicines can be given using either an inhaler or a nebulizer machine.  Corticosteroid medicines may be given for severe bronchospasm, usually when it is associated with asthma. HOME CARE INSTRUCTIONS   Always have a plan prepared for seeking medical care. Know when to call your health care provider and local emergency services (911 in the [place] Know where you can access local emergency care.  Only take medicines as  directed by your health care provider.  If you were prescribed an inhaler or nebulizer machine, ask your health care provider to explain how to use it correctly. Always use a spacer with your inhaler if you were given one.  It is necessary to remain calm during an attack. Try to relax and breathe more slowly.  Control your home environment in the following ways:   Change your heating and air conditioning filter at least once a month.   Limit your use of fireplaces and wood stoves.  Do not smoke and do not allow smoking in your home.   Avoid exposure to perfumes and fragrances.   Get rid of pests (such as roaches and mice) and their droppings.   Throw away plants if you see mold on them.   Keep your house clean and dust free.   Replace carpet with wood, tile, or vinyl flooring. Carpet can trap dander and dust.   Use allergy-proof pillows, mattress covers, and box spring covers.   Wash bed sheets and blankets every week in hot water and dry them in a dryer.   Use blankets that are made of polyester or cotton.   Wash hands frequently. SEEK MEDICAL CARE IF:   You have muscle aches.   You have chest pain.   The sputum changes from clear or white to yellow, green, gray, or bloody.   The sputum you cough up gets thicker.   There are problems that may be related to the medicine you are given, such as a rash, itching, swelling, or trouble breathing.  SEEK IMMEDIATE MEDICAL CARE IF:   You have worsening wheezing and coughing  even after taking your prescribed medicines.   You have increased difficulty breathing.   You develop severe chest pain. MAKE SURE YOU:   Understand these instructions.  Will watch your condition.  Will get help right away if you are not doing well or get worse.   This information is not intended to replace advice given to you by your health care provider. Make sure you discuss any questions you have with your health care  provider.   Upper Respiratory Infection, Adult Most upper respiratory infections (URIs) are a viral infection of the air passages leading to the lungs. A URI affects the nose, throat, and upper air passages. The most common type of URI is nasopharyngitis and is typically referred to as "the common cold." URIs run their course and usually go away on their own. Most of the time, a URI does not require medical attention, but sometimes a bacterial infection in the upper airways can follow a viral infection. This is called a secondary infection. Sinus and middle ear infections are common types of secondary upper respiratory infections. Bacterial pneumonia can also complicate a URI. A URI can worsen asthma and chronic obstructive pulmonary disease (COPD). Sometimes, these complications can require emergency medical care and may be life threatening.  CAUSES Almost all URIs are caused by viruses. A virus is a type of germ and can spread from one person to another.  RISKS FACTORS You may be at risk for a URI if:   You smoke.   You have chronic heart or lung disease.  You have a weakened defense (immune) system.   You are very young or very old.   You have nasal allergies or asthma.  You work in crowded or poorly ventilated areas.  You work in health care facilities or schools. SIGNS AND SYMPTOMS  Symptoms typically develop 2-3 days after you come in contact with a cold virus. Most viral URIs last 7-10 days. However, viral URIs from the influenza virus (flu virus) can last 14-18 days and are typically more severe. Symptoms may include:   Runny or stuffy (congested) nose.   Sneezing.   Cough.   Sore throat.   Headache.   Fatigue.   Fever.   Loss of appetite.   Pain in your forehead, behind your eyes, and over your cheekbones (sinus pain).  Muscle aches.  DIAGNOSIS  Your health care provider may diagnose a URI by:  Physical exam.  Tests to check that your symptoms  are not due to another condition such as:  Strep throat.  Sinusitis.  Pneumonia.  Asthma. TREATMENT  A URI goes away on its own with time. It cannot be cured with medicines, but medicines may be prescribed or recommended to relieve symptoms. Medicines may help:  Reduce your fever.  Reduce your cough.  Relieve nasal congestion. HOME CARE INSTRUCTIONS   Take medicines only as directed by your health care provider.   Gargle warm saltwater or take cough drops to comfort your throat as directed by your health care provider.  Use a warm mist humidifier or inhale steam from a shower to increase air moisture. This may make it easier to breathe.  Drink enough fluid to keep your urine clear or pale yellow.   Eat soups and other clear broths and maintain good nutrition.   Rest as needed.   Return to work when your temperature has returned to normal or as your health care provider advises. You may need to stay home longer to avoid infecting  others. You can also use a face mask and careful hand washing to prevent spread of the virus.  Increase the usage of your inhaler if you have asthma.   Do not use any tobacco products, including cigarettes, chewing tobacco, or electronic cigarettes. If you need help quitting, ask your health care provider. PREVENTION  The best way to protect yourself from getting a cold is to practice good hygiene.   Avoid oral or hand contact with people with cold symptoms.   Wash your hands often if contact occurs.  There is no clear evidence that vitamin C, vitamin E, echinacea, or exercise reduces the chance of developing a cold. However, it is always recommended to get plenty of rest, exercise, and practice good nutrition.  SEEK MEDICAL CARE IF:   You are getting worse rather than better.   Your symptoms are not controlled by medicine.   You have chills.  You have worsening shortness of breath.  You have brown or red mucus.  You have yellow  or brown nasal discharge.  You have pain in your face, especially when you bend forward.  You have a fever.  You have swollen neck glands.  You have pain while swallowing.  You have white areas in the back of your throat. SEEK IMMEDIATE MEDICAL CARE IF:   You have severe or persistent:  Headache.  Ear pain.  Sinus pain.  Chest pain.  You have chronic lung disease and any of the following:  Wheezing.  Prolonged cough.  Coughing up blood.  A change in your usual mucus.  You have a stiff neck.  You have changes in your:  Vision.  Hearing.  Thinking.  Mood. MAKE SURE YOU:   Understand these instructions.  Will watch your condition.  Will get help right away if you are not doing well or get worse.   This information is not intended to replace advice given to you by your health care provider. Make sure you discuss any questions you have with your health care provider.   Document Released: 05/21/2001 Document Revised: 04/11/2015 Document Reviewed: 03/02/2014 Elsevier Interactive Patient Education [employer]2016 [employer]

## 2016-05-21 NOTE — ED Notes (Signed)
Pt reports hx of bronchitis, out of inhaler since last night, c/o dry, nonproductive cough and chest pain, o2 sats 89%on RA

## 2016-06-08 IMAGING — US US ABDOMEN COMPLETE
1 series · 13 of 25 positions shown · non-contrast
Comparison: Prior ultrasound on 04/21/2006

CLINICAL DATA: Abdominal pain.

EXAM:
ABDOMEN ULTRASOUND COMPLETE

[Series 1: us abdomen complete · 0.22mm/px · 13 of 118 slices shown]
[im 1/118]
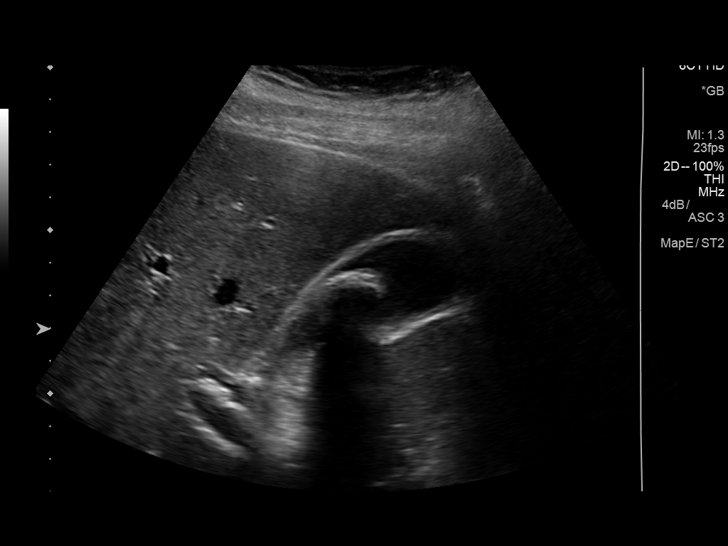
[im 10/118]
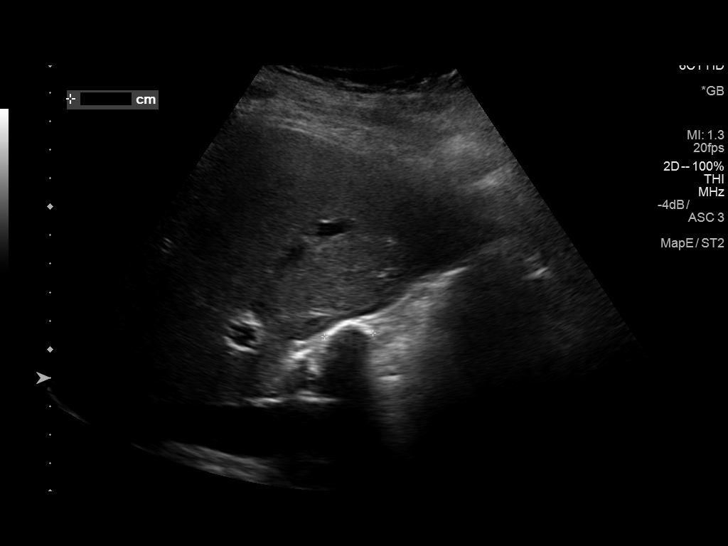
[im 20/118]
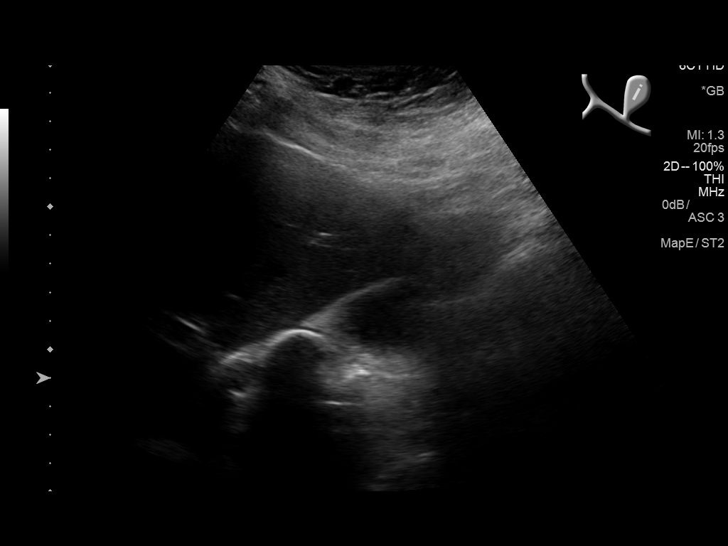
[im 30/118]
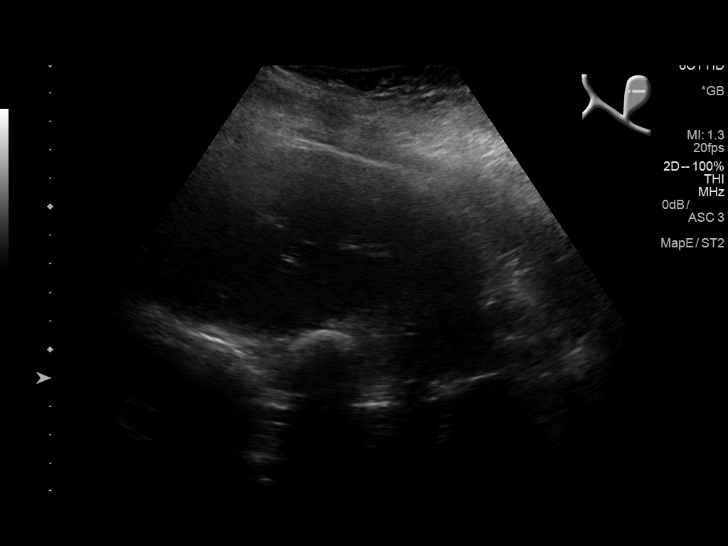
[im 40/118]
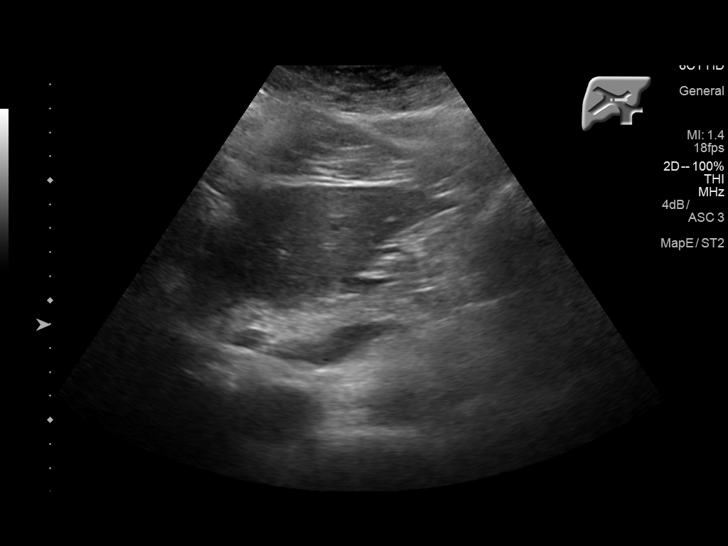
[im 49/118]
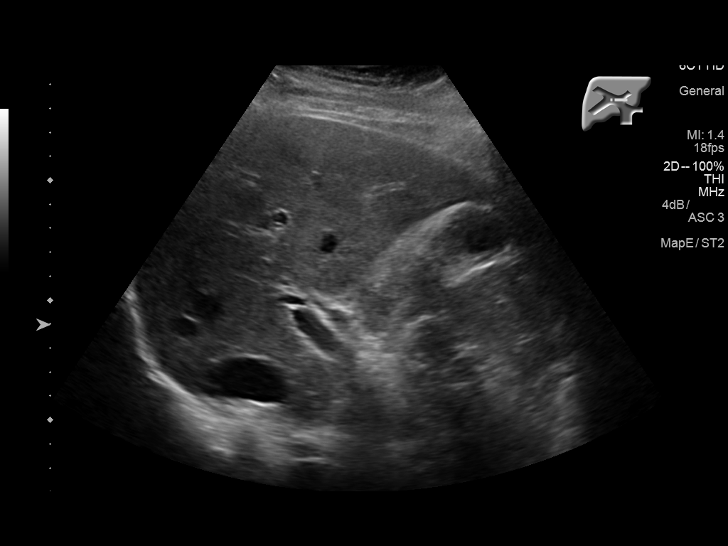
[im 59/118]
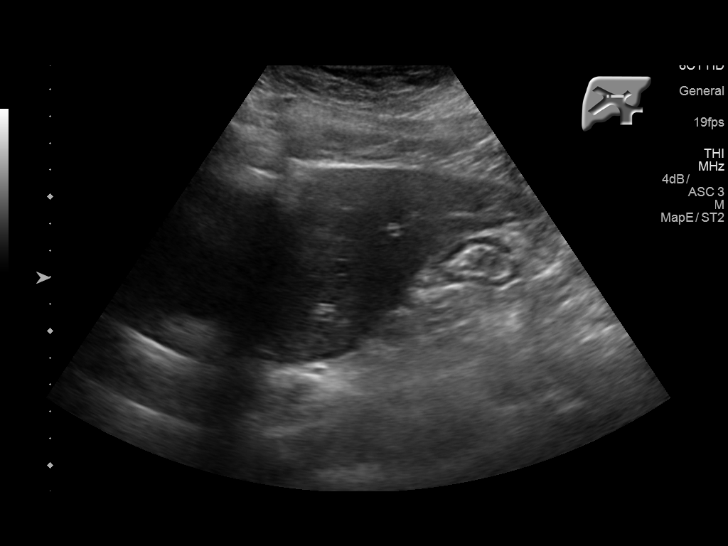
[im 69/118]
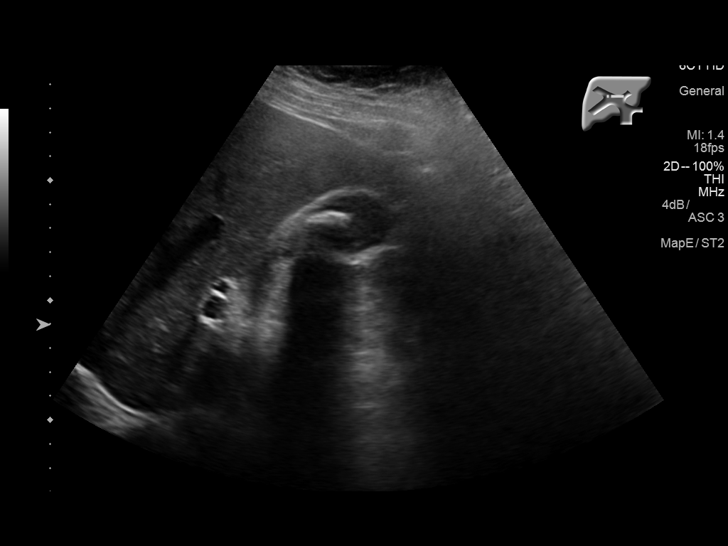
[im 79/118]
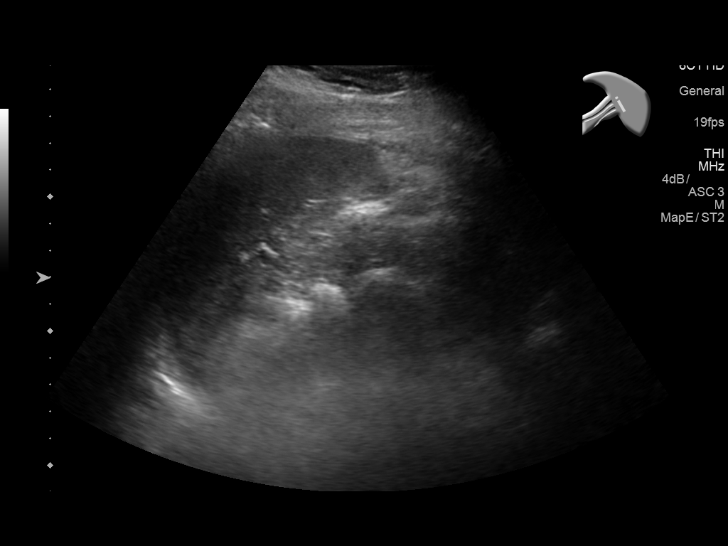
[im 88/118]
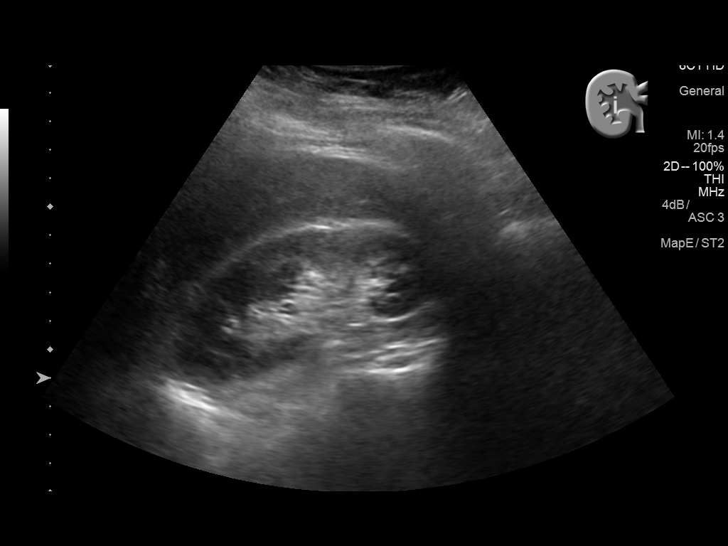
[im 98/118]
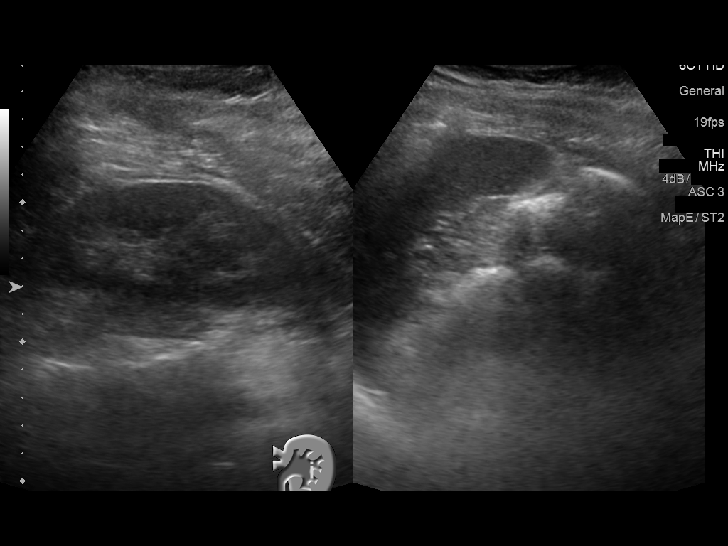
[im 108/118]
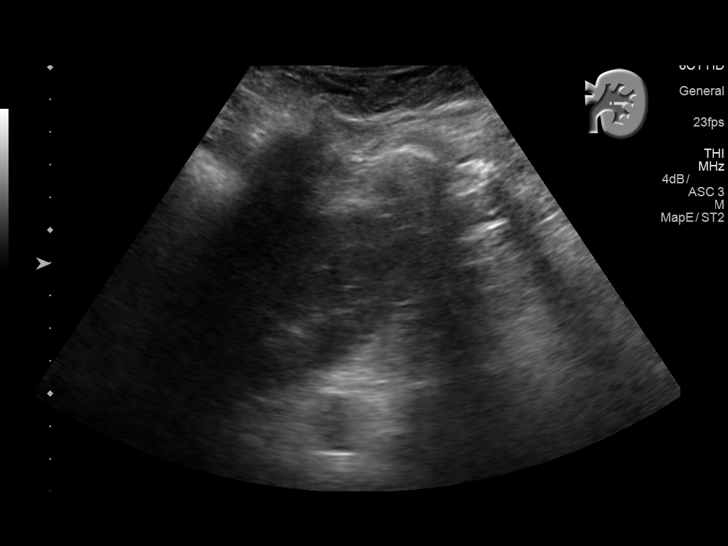
[im 118/118]
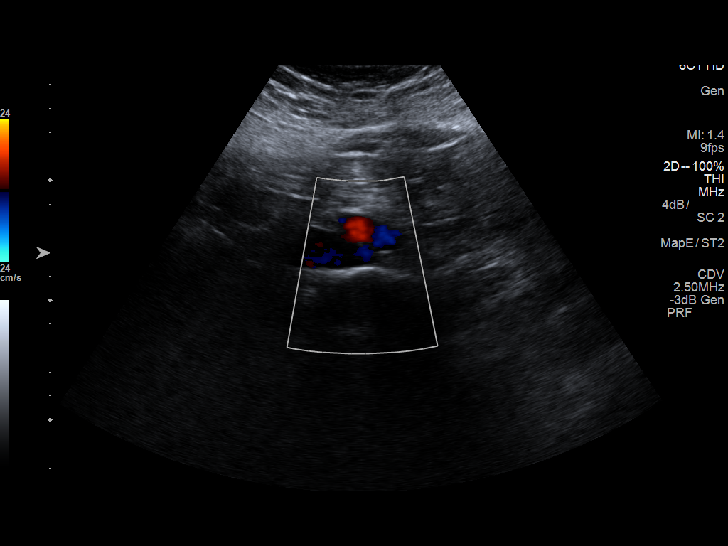

[13 of 25 positions shown; findings below may reference images not displayed]

FINDINGS: Gallbladder: Interval development of cholelithiasis with a large
dominant gallstone measuring at least 2.5 cm within the gallbladder
lumen and potentially lodged in the gallbladder neck. There is
evidence of edema in the gallbladder wall with thickness measurement
of approximately 4 - 5 mm. Lack of elicitation of a sonographic
Murphy's sign was noted by the sonographer. It was noted that the
patient had been given pain medication just prior to the procedure.

Common bile duct: Diameter: Top-normal common bile duct caliber with
maximum measured diameter of 7 mm. No evidence of visualized
choledocholithiasis.

Liver: No focal lesion identified. Within normal limits in
parenchymal echogenicity. No evidence of intrahepatic biliary ductal
dilatation.

IVC: No abnormality visualized.

Pancreas: Visualized portion unremarkable.

Spleen: Size and appearance within normal limits.

Right Kidney: Length: 10.1 cm. Echogenicity within normal limits. No
mass or hydronephrosis visualized.

Left Kidney: Length: 10.5 cm. Echogenicity within normal limits. No
mass or hydronephrosis visualized.

Abdominal aorta: No aneurysm visualized.

Other findings: None.
IMPRESSION: 1. Cholelithiasis with a dominant large gallstone identified which
may be partially lodged in the neck of the gallbladder. Associated
gallbladder wall edema and thickening is suggestive of
cholecystitis.
2. Top-normal common bile duct diameter without sonographic evidence
of choledocholithiasis.

## 2016-10-12 ENCOUNTER — Encounter (HOSPITAL_COMMUNITY): Payer: Self-pay

## 2016-10-12 ENCOUNTER — Emergency Department (HOSPITAL_COMMUNITY)
Admission: EM | Admit: 2016-10-12 | Discharge: 2016-10-12 | Disposition: A | Discharge status: Home / Self Care | Payer: Medicare Other | Attending: Emergency Medicine | Admitting: Emergency Medicine

## 2016-10-12 DIAGNOSIS — F17210 Nicotine dependence, cigarettes, uncomplicated: Secondary | ICD-10-CM | POA: Insufficient documentation

## 2016-10-12 DIAGNOSIS — K0889 Other specified disorders of teeth and supporting structures: Secondary | ICD-10-CM

## 2016-10-12 DIAGNOSIS — K0402 Irreversible pulpitis: Secondary | ICD-10-CM | POA: Insufficient documentation

## 2016-10-12 DIAGNOSIS — K029 Dental caries, unspecified: Secondary | ICD-10-CM | POA: Insufficient documentation

## 2016-10-12 DIAGNOSIS — K0401 Reversible pulpitis: Secondary | ICD-10-CM

## 2016-10-12 MED ORDER — CLINDAMYCIN HCL 150 MG PO CAPS: 450.0000 mg | ORAL_CAPSULE | Freq: Three times a day (TID) | ORAL | 0 refills | Status: AC

## 2016-10-12 NOTE — ED Triage Notes (Signed)
She c/o right upper toothache for which she has recently been taking penicillin. She c/o some vomiting yesterday, but not today. She states the pain began in one tooth and has now spread to her entire right zygoma area. She is tearful.

## 2016-10-12 NOTE — ED Provider Notes (Signed)
[facility]-EMERGENCY DEPT Provider Note   CSN: [id number] Arrival date & time: 10/12/16  1617     History   Chief Complaint Chief Complaint  Patient presents with  . Dental Pain    HPI [name] is a 40 y.o. female.  The history is provided by the patient.  Dental Pain   This is a recurrent problem. Episode onset: several weeks. The problem occurs constantly. The problem has been gradually worsening. The pain is moderate. Treatments tried: PCN. The treatment provided moderate relief.    Past Medical History:  Diagnosis Date  . Degenerative disc disease     Patient Active Problem List   Diagnosis Date Noted  . Biliary colic 12/27/2015  . Cholecystitis with cholelithiasis 12/27/2015    Past Surgical History:  Procedure Laterality Date  . CESAREAN SECTION    . CHOLECYSTECTOMY Right 12/28/2015   Procedure: LAPAROSCOPIC CHOLECYSTECTOMY WITH INTRAOPERATIVE CHOLANGIOGRAM;  Surgeon: Claud KelpHaywood Ingram, MD;  Location: [facility];  Service: General;  Laterality: Right;  . TUBAL LIGATION      OB History    No data available       Home Medications    Prior to Admission medications   Medication Sig Start Date End Date Taking? Authorizing Provider  albuterol (PROVENTIL HFA;VENTOLIN HFA) 108 (90 Base) MCG/ACT inhaler Inhale 1-2 puffs into the lungs every 6 (six) hours as needed for wheezing or shortness of breath.    Historical Provider, MD  ciprofloxacin-dexamethasone (CIPRODEX) otic suspension Place 4 drops into the right ear 2 (two) times daily. Patient not taking: Reported on 05/21/2016 04/16/16   Roxy Horsemanobert Browning, PA-C  clindamycin (CLEOCIN) 150 MG capsule Take 3 capsules (450 mg total) by mouth 3 (three) times daily. 10/12/16 10/19/16  Nira ConnPedro Eduardo Bode Pieper, MD  naproxen sodium (ANAPROX) 220 MG tablet Take 220 mg by mouth every 12 (twelve) hours as needed (for pain).    Historical Provider, MD  ondansetron (ZOFRAN-ODT) 4 MG disintegrating tablet Take 1 tablet (4 mg total) by mouth  every 6 (six) hours as needed for nausea. Patient not taking: Reported on 05/21/2016 12/29/15   Ashok NorrisEmina Riebock, NP  oxyCODONE-acetaminophen (PERCOCET/ROXICET) 5-325 MG tablet Take 1-2 tablets by mouth every 4 (four) hours as needed for moderate pain or severe pain. Patient not taking: Reported on 05/21/2016 12/29/15   Ashok NorrisEmina Riebock, NP  predniSONE (DELTASONE) 20 MG tablet 3 tabs po day one, then 2 po daily x 4 days 05/21/16   Arby BarretteMarcy Pfeiffer, MD    Family History Family History  Problem Relation Age of Onset  . Diabetes Mother   . Hypertension Mother   . Stroke Brother     Social History Social History  Substance Use Topics  . Smoking status: Current Every Day Smoker    Packs/day: 1.00    Types: Cigarettes  . Smokeless tobacco: Never Used  . Alcohol use No     Allergies   Bactrim [sulfamethoxazole-trimethoprim]   Review of Systems Review of Systems  HENT: Positive for dental problem. Negative for facial swelling.   All other systems reviewed and are negative.    Physical Exam Updated Vital Signs BP (!) 134/106 (BP Location: Right Arm)   Pulse 92   Temp 97.9 F (36.6 C) (Oral)   Resp 18   LMP 09/09/2016 (Approximate)   SpO2 100%   Physical Exam  Constitutional: She is oriented to person, place, and time. She appears well-developed and well-nourished. No distress.  HENT:  Head: Normocephalic and atraumatic.  Right Ear: External ear  normal.  Left Ear: External ear normal.  Nose: Nose normal.  Mouth/Throat: Abnormal dentition. Dental caries present.    Tenderness to right upper alveoli.  Eyes: Conjunctivae and EOM are normal. No scleral icterus.  Neck: Normal range of motion and phonation normal.  Cardiovascular: Normal rate and regular rhythm.   Pulmonary/Chest: Effort normal. No stridor. No respiratory distress.  Abdominal: She exhibits no distension.  Musculoskeletal: Normal range of motion. She exhibits no edema.  Neurological: She is alert and oriented to  person, place, and time.  Skin: She is not diaphoretic.  Psychiatric: She has a normal mood and affect. Her behavior is normal.  Vitals reviewed.    ED Treatments / Results  Labs (all labs ordered are listed, but only abnormal results are displayed) Labs Reviewed - No data to display  EKG  EKG Interpretation None       Radiology No results found.  Procedures Procedures (including critical care time)  Medications Ordered in ED Medications - No data to display   Initial Impression / Assessment and Plan / ED Course  I have reviewed the triage vital signs and the nursing notes.  Pertinent labs & imaging results that were available during my care of the patient were reviewed by me and considered in my medical decision making (see chart for details).  Clinical Course    Likely pulpitis. No evidence of periodontal abscess. Patient already is seen by dentistry, and has an appointment next Thursday. We'll give clindamycin and have patient follow-up with dentistry as scheduled.  The patient is safe for discharge with strict return precautions.   Final Clinical Impressions(s) / ED Diagnoses   Final diagnoses:  Dental caries  Toothache  Pulpitis   Disposition: Discharge  Condition: Good  I have discussed the results, Dx and Tx plan with the patient who expressed understanding and agree(s) with the plan. Discharge instructions discussed at great length. The patient was given strict return precautions who verbalized understanding of the instructions. No further questions at time of discharge.    New Prescriptions   CLINDAMYCIN (CLEOCIN) 150 MG CAPSULE    Take 3 capsules (450 mg total) by mouth 3 (three) times daily.    Follow Up: Dentistry   as scheduled for next Thursday.      Nira ConnPedro Eduardo Amarri Michaelson, MD 10/12/16 [phone]

## 2016-11-28 ENCOUNTER — Encounter (HOSPITAL_COMMUNITY): Payer: Self-pay | Admitting: Emergency Medicine

## 2016-11-28 ENCOUNTER — Emergency Department (HOSPITAL_COMMUNITY)
Admission: EM | Admit: 2016-11-28 | Discharge: 2016-11-28 | Discharge status: Against Medical Advice | Payer: Medicare Other | Attending: Emergency Medicine | Admitting: Emergency Medicine

## 2016-11-28 DIAGNOSIS — K0889 Other specified disorders of teeth and supporting structures: Secondary | ICD-10-CM | POA: Diagnosis not present

## 2016-11-28 DIAGNOSIS — F17210 Nicotine dependence, cigarettes, uncomplicated: Secondary | ICD-10-CM | POA: Insufficient documentation

## 2016-11-28 DIAGNOSIS — Z79899 Other long term (current) drug therapy: Secondary | ICD-10-CM | POA: Insufficient documentation

## 2016-11-28 MED ORDER — BUPIVACAINE-EPINEPHRINE (PF) 0.5% -1:200000 IJ SOLN
1.8000 mL | Freq: Once | INTRAMUSCULAR | Status: AC
  Administered 2016-11-28: 1.8 mL
  Filled 2016-11-28: qty 1.8

## 2016-11-28 NOTE — ED Triage Notes (Signed)
Patient reports right upper dental abscess x2 days. Patient reports taking clindamycin as prescribed. Denies fevers.

## 2016-11-28 NOTE — ED Notes (Signed)
Upon going to check on patient, patient and belongings missing from room. Patient last seen in NAD and ambulatory with steady gait without difficulty.

## 2016-11-28 NOTE — ED Provider Notes (Signed)
[facility]-EMERGENCY DEPT Provider Note   CSN: [id number] Arrival date & time: 11/28/16  1524  By signing my name below, I, [name], attest that this documentation has been prepared under the direction and in the presence of Lori C. Joy, PA-C. Electronically Signed: Placido SouLogan [name], ED Scribe. 11/28/16. 3:44 PM.   History   Chief Complaint Chief Complaint  Patient presents with  . Dental Pain   HPI HPI Comments: [name] is a 40 y.o. female who presents to the Emergency Department complaining of constant, moderate, right upper dental pain onset in the past few days. She states she has a broken tooth in the region and attempted to have it extracted last month w/o success and has an appointment to have it surgically removed on 12/24/2015. Her pain worsens with any palpation in the region and when chewing. She is 7 days into a 21 day course of clindamycin which she has been taking for this issue. Denies fever/chills, nausea/vomiting, difficulty breathing or swallowing, or any other complaints.     The history is provided by the patient and medical records. No language interpreter was used.    Past Medical History:  Diagnosis Date  . Degenerative disc disease     Patient Active Problem List   Diagnosis Date Noted  . Biliary colic 12/27/2015  . Cholecystitis with cholelithiasis 12/27/2015    Past Surgical History:  Procedure Laterality Date  . CESAREAN SECTION    . CHOLECYSTECTOMY Right 12/28/2015   Procedure: LAPAROSCOPIC CHOLECYSTECTOMY WITH INTRAOPERATIVE CHOLANGIOGRAM;  Surgeon: Claud KelpHaywood Ingram, MD;  Location: [facility];  Service: General;  Laterality: Right;  . TUBAL LIGATION      OB History    No data available     Home Medications    Prior to Admission medications   Medication Sig Start Date End Date Taking? Authorizing Provider  albuterol (PROVENTIL HFA;VENTOLIN HFA) 108 (90 Base) MCG/ACT inhaler Inhale 1-2 puffs into the lungs every 6 (six) hours as needed  for wheezing or shortness of breath.    Historical Provider, MD  ciprofloxacin-dexamethasone (CIPRODEX) otic suspension Place 4 drops into the right ear 2 (two) times daily. Patient not taking: Reported on 05/21/2016 04/16/16   Roxy Horsemanobert Browning, PA-C  naproxen sodium (ANAPROX) 220 MG tablet Take 220 mg by mouth every 12 (twelve) hours as needed (for pain).    Historical Provider, MD  ondansetron (ZOFRAN-ODT) 4 MG disintegrating tablet Take 1 tablet (4 mg total) by mouth every 6 (six) hours as needed for nausea. Patient not taking: Reported on 05/21/2016 12/29/15   Ashok NorrisEmina Riebock, NP  oxyCODONE-acetaminophen (PERCOCET/ROXICET) 5-325 MG tablet Take 1-2 tablets by mouth every 4 (four) hours as needed for moderate pain or severe pain. Patient not taking: Reported on 05/21/2016 12/29/15   Ashok NorrisEmina Riebock, NP  predniSONE (DELTASONE) 20 MG tablet 3 tabs po day one, then 2 po daily x 4 days 05/21/16   Arby BarretteMarcy Pfeiffer, MD    Family History Family History  Problem Relation Age of Onset  . Diabetes Mother   . Hypertension Mother   . Stroke Brother     Social History Social History  Substance Use Topics  . Smoking status: Current Every Day Smoker    Packs/day: 1.00    Types: Cigarettes  . Smokeless tobacco: Never Used  . Alcohol use No   Allergies   Bactrim [sulfamethoxazole-trimethoprim]   Review of Systems Review of Systems  Constitutional: Negative for chills and fever.  HENT: Positive for dental problem. Negative for facial swelling,  sore throat, trouble swallowing and voice change.   Respiratory: Negative for shortness of breath.    Physical Exam Updated Vital Signs BP 150/100 (BP Location: Right Arm)   Pulse 72   Temp 98.8 F (37.1 C)   Resp 18   Ht 5\' 11"  (1.803 m)   SpO2 100%   Physical Exam  Constitutional: She appears well-developed and well-nourished. No distress.  HENT:  Head: Normocephalic and atraumatic.  Tenderness surrounding the rear most right side maxillary molar.  Tenderness extends to the hard palate and into the soft palate. Questionable area of fluctuance in the soft palate on the right side. Readily handles oral secretions. Speaks in full sentences. No trismus. Opens mouth to at least three finger widths. No noted facial swelling.  Eyes: Conjunctivae are normal.  Neck: Neck supple.  Cardiovascular: Normal rate and regular rhythm.   Pulmonary/Chest: Effort normal.  Neurological: She is alert.  Skin: Skin is warm and dry. She is not diaphoretic.  Psychiatric: She has a normal mood and affect. Her behavior is normal.  Nursing note and vitals reviewed.  ED Treatments / Results  Labs (all labs ordered are listed, but only abnormal results are displayed) Labs Reviewed - No data to display  EKG  EKG Interpretation None       Radiology No results found.  Procedures Dental Block Date/Time: 11/28/2016 4:16 PM Performed by: Anselm Pancoast Authorized by: Harolyn Rutherford C   Consent:    Consent obtained:  Verbal   Consent given by:  Patient   Risks discussed:  Unsuccessful block Indications:    Indications: dental pain   Location:    Block type:  Posterior superior alveolar   Laterality:  Right Procedure details (see MAR for exact dosages):    Needle gauge:  27 G   Anesthetic injected:  Bupivacaine 0.5% WITH epi   Injection procedure:  Anatomic landmarks identified, anatomic landmarks palpated, negative aspiration for blood, introduced needle and incremental injection Post-procedure details:    Outcome:  Pain relieved   Patient tolerance of procedure:  Tolerated well, no immediate complications    COORDINATION OF CARE: 3:44 PM Discussed next steps with pt. Pt verbalized understanding and is agreeable with the plan.    Medications Ordered in ED Medications  bupivacaine-epinephrine (MARCAINE W/ EPI) 0.5% -1:200000 injection 1.8 mL (1.8 mLs Infiltration Given by Other 11/28/16 1603)     Initial Impression / Assessment and Plan / ED  Course  I have reviewed the triage vital signs and the nursing notes.  Pertinent labs & imaging results that were available during my care of the patient were reviewed by me and considered in my medical decision making (see chart for details).  Clinical Course     Patient presents with dental pain. No signs of sepsis or abscess that would benefit from bedside I&D. No signs of Ludwig's angioedema. Patient encouraged to continue clindamycin regimen. Patient walked out of the department following the dental block without notifying staff or waiting for her discharge instructions. Patient is always welcome to come back should she change her mind.     I personally performed the services described in this documentation, which was scribed in my presence. The recorded information has been reviewed and is accurate. Final Clinical Impressions(s) / ED Diagnoses   Final diagnoses:  Pain, dental    New Prescriptions Discharge Medication List as of 11/28/2016  5:23 PM       Anselm Pancoast, PA-C 11/29/16 1030    Lori  Bretta BangC Joy, PA-C 11/29/16 1051    Pricilla LovelessScott Goldston, MD 11/30/16 2324

## 2017-02-06 IMAGING — CR DG CHEST 2V
2 series · 2 of 2 positions shown · non-contrast
Comparison: None currently available. 10/31/2008 thoracic spine
radiograph.

CLINICAL DATA: Shortness of breath

EXAM:
CHEST  2 VIEW

[w chest pa]
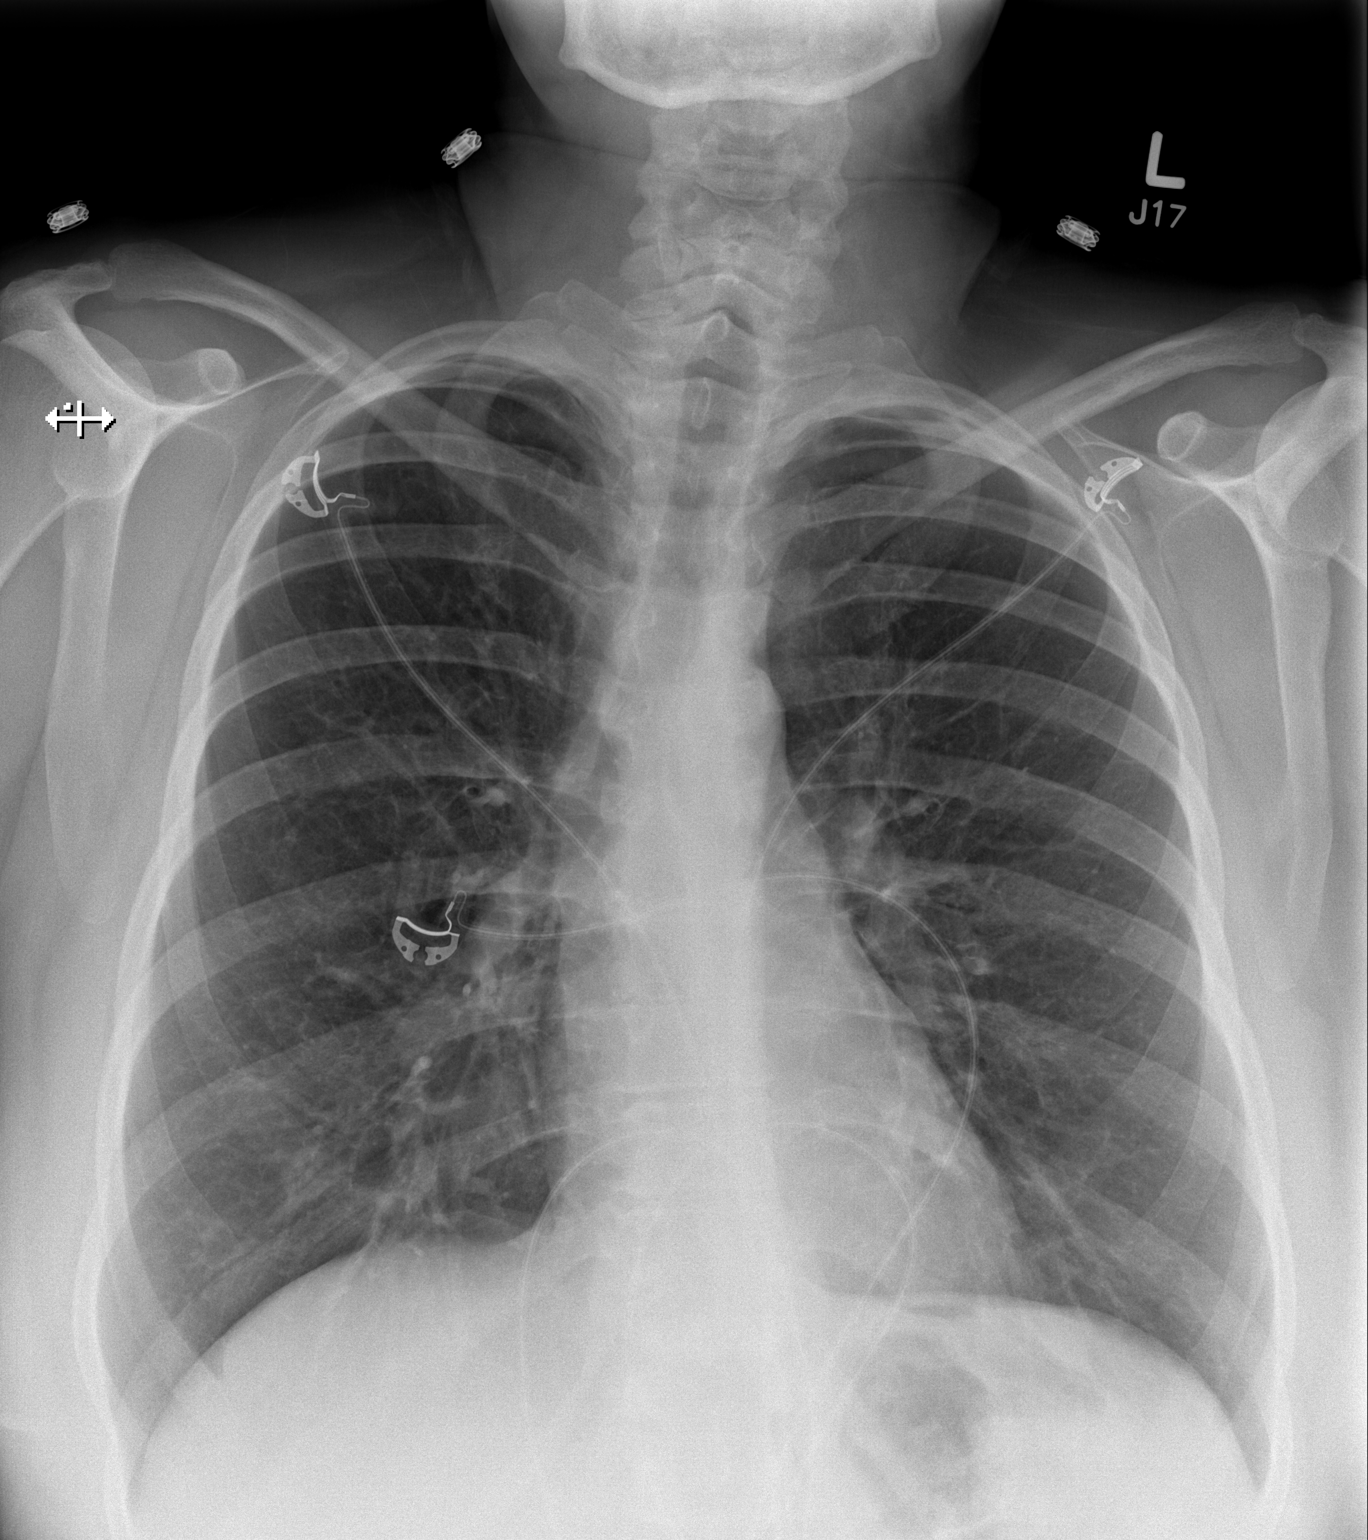

[w chest lat]
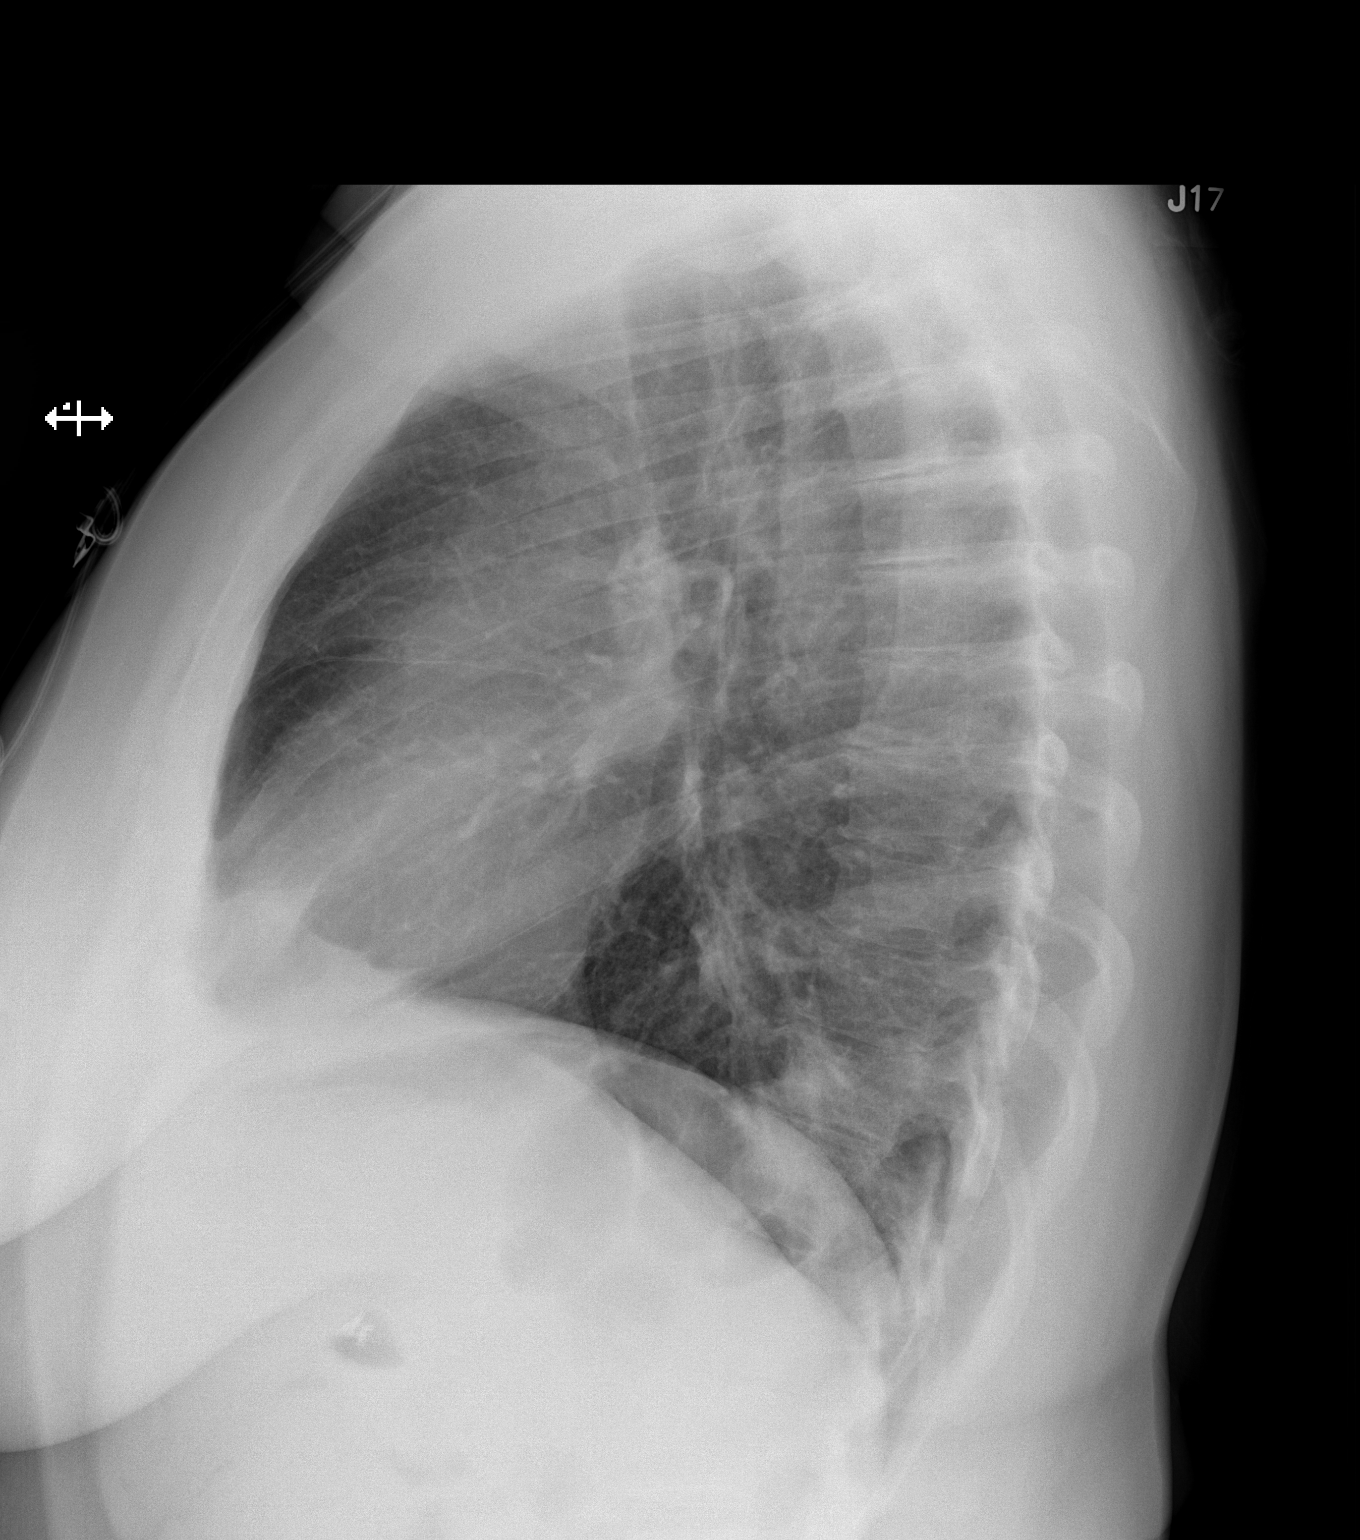

[2 of 2 positions shown; findings below may reference images not displayed]

FINDINGS: Normal heart size and mediastinal contours. No acute infiltrate or
edema. No effusion or pneumothorax. Focal lower thoracic advanced
disc degeneration with spurring. Cholecystectomy clips.
IMPRESSION: No active cardiopulmonary disease.

## 2017-07-14 DIAGNOSIS — Z720 Tobacco use: Secondary | ICD-10-CM | POA: Diagnosis not present

## 2017-07-14 DIAGNOSIS — M25561 Pain in right knee: Secondary | ICD-10-CM | POA: Diagnosis not present

## 2017-07-14 DIAGNOSIS — F319 Bipolar disorder, unspecified: Secondary | ICD-10-CM | POA: Diagnosis not present

## 2017-07-14 DIAGNOSIS — Z124 Encounter for screening for malignant neoplasm of cervix: Secondary | ICD-10-CM | POA: Diagnosis not present

## 2017-08-14 DIAGNOSIS — Z720 Tobacco use: Secondary | ICD-10-CM | POA: Diagnosis not present

## 2017-08-14 DIAGNOSIS — H109 Unspecified conjunctivitis: Secondary | ICD-10-CM | POA: Diagnosis not present

## 2017-08-14 DIAGNOSIS — J209 Acute bronchitis, unspecified: Secondary | ICD-10-CM | POA: Diagnosis not present

## 2017-08-14 DIAGNOSIS — J329 Chronic sinusitis, unspecified: Secondary | ICD-10-CM | POA: Diagnosis not present

## 2019-07-15 ENCOUNTER — Emergency Department
Admission: EM | Admit: 2019-07-15 | Discharge: 2019-08-10 | Disposition: E | Payer: Medicare Other | Attending: Emergency Medicine | Admitting: Emergency Medicine

## 2019-07-15 DIAGNOSIS — I469 Cardiac arrest, cause unspecified: Secondary | ICD-10-CM | POA: Insufficient documentation

## 2019-07-15 DIAGNOSIS — I468 Cardiac arrest due to other underlying condition: Secondary | ICD-10-CM

## 2019-07-15 MED ORDER — EPINEPHRINE 1 MG/10ML IJ SOSY
PREFILLED_SYRINGE | INTRAMUSCULAR | Status: AC | PRN
  Administered 2019-07-15 (×4): 1 via INTRAVENOUS

## 2019-07-15 MED ORDER — ATROPINE SULFATE 1 MG/ML IJ SOLN
INTRAMUSCULAR | Status: AC | PRN
  Administered 2019-07-15: 1 mg via INTRAVENOUS

## 2019-07-15 MED FILL — Medication: Qty: 1 | Status: AC
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
## 2019-08-10 DEATH — deceased
